# Patient Record
Sex: Male | Born: 1965 | Race: Asian | Hispanic: No | Marital: Married | Smoking: Never smoker
Health system: Southern US, Community
[De-identification: ages and names within clinical notes are randomized; demographics above are authoritative.]

---

## 2013-10-02 ENCOUNTER — Ambulatory Visit (INDEPENDENT_AMBULATORY_CARE_PROVIDER_SITE_OTHER): Payer: BC Managed Care – PPO | Admitting: Emergency Medicine

## 2013-10-02 ENCOUNTER — Ambulatory Visit (INDEPENDENT_AMBULATORY_CARE_PROVIDER_SITE_OTHER): Payer: BC Managed Care – PPO

## 2013-10-02 VITALS — BP 114/64 | HR 52 | Temp 97.6°F | Resp 20 | Ht 63.5 in | Wt 181.8 lb

## 2013-10-02 DIAGNOSIS — J209 Acute bronchitis, unspecified: Secondary | ICD-10-CM

## 2013-10-02 DIAGNOSIS — R05 Cough: Secondary | ICD-10-CM

## 2013-10-02 DIAGNOSIS — R059 Cough, unspecified: Secondary | ICD-10-CM

## 2013-10-02 LAB — CBC WITH DIFFERENTIAL/PLATELET
BASOS ABS: 0.1 10*3/uL (ref 0.0–0.1)
Basophils Relative: 1 % (ref 0–1)
Eosinophils Absolute: 0.2 10*3/uL (ref 0.0–0.7)
Eosinophils Relative: 4 % (ref 0–5)
HCT: 41.5 % (ref 39.0–52.0)
Hemoglobin: 14.2 g/dL (ref 13.0–17.0)
LYMPHS PCT: 49 % — AB (ref 12–46)
Lymphs Abs: 2.8 10*3/uL (ref 0.7–4.0)
MCH: 31.3 pg (ref 26.0–34.0)
MCHC: 34.2 g/dL (ref 30.0–36.0)
MCV: 91.4 fL (ref 78.0–100.0)
Monocytes Absolute: 0.6 10*3/uL (ref 0.1–1.0)
Monocytes Relative: 10 % (ref 3–12)
NEUTROS ABS: 2.1 10*3/uL (ref 1.7–7.7)
Neutrophils Relative %: 36 % — ABNORMAL LOW (ref 43–77)
Platelets: 144 10*3/uL — ABNORMAL LOW (ref 150–400)
RBC: 4.54 MIL/uL (ref 4.22–5.81)
RDW: 13.3 % (ref 11.5–15.5)
WBC: 5.8 10*3/uL (ref 4.0–10.5)

## 2013-10-02 LAB — COMPREHENSIVE METABOLIC PANEL
ALBUMIN: 3.9 g/dL (ref 3.5–5.2)
ALT: 49 U/L (ref 0–53)
AST: 63 U/L — AB (ref 0–37)
Alkaline Phosphatase: 48 U/L (ref 39–117)
BUN: 19 mg/dL (ref 6–23)
CHLORIDE: 107 meq/L (ref 96–112)
CO2: 26 mEq/L (ref 19–32)
CREATININE: 1.04 mg/dL (ref 0.50–1.35)
Calcium: 8.6 mg/dL (ref 8.4–10.5)
Glucose, Bld: 99 mg/dL (ref 70–99)
POTASSIUM: 3.9 meq/L (ref 3.5–5.3)
Sodium: 139 mEq/L (ref 135–145)
Total Bilirubin: 0.7 mg/dL (ref 0.2–1.2)
Total Protein: 7.1 g/dL (ref 6.0–8.3)

## 2013-10-02 MED ORDER — AZITHROMYCIN 250 MG PO TABS: ORAL_TABLET | ORAL | Status: DC

## 2013-10-02 NOTE — Progress Notes (Signed)
[facility] [address], [place] [id number] [phone]  Date:  10/02/2013   Name:  Sergio Smith   DOB:  03/08/1966   MRN:  [id number]  PCP:  No primary provider on file.    Chief Complaint: Sore Throat   History of Present Illness:  Sergio Smith is a 48 y.o. very pleasant male patient who presents with the following:  Traveled to [place] a month ago and stayed two weeks.  Since returning has a fever, cough and post nasal drainage and a sore throat.  Cough is productive of a bloody mucoid sputum.  Says he feels short of breath and is forced to sleep with a couple pillows propping him up.  Has no wheezing.  Has exertional shortness of breath and fatigue with stairs.  No nausea or vomiting.  Poor appetite.  No wheezing.  No peripheral edema.  No improvement with over the counter medications or other home remedies. Denies other complaint or health concern today.   There are no active problems to display for this patient.   No past medical history on file.  No past surgical history on file.  History  Substance Use Topics  . Smoking status: Never Smoker   . Smokeless tobacco: Never Used  . Alcohol Use: No    Family History  Problem Relation Age of Onset  . Diabetes Father   . Heart failure Father   . Hypertension Father   . Stroke Father     No Known Allergies  Medication list has been reviewed and updated.  No current outpatient prescriptions on file prior to visit.   No current facility-administered medications on file prior to visit.    Review of Systems:  As per HPI, otherwise negative.    Physical Examination: Filed Vitals:   10/02/13 1501  BP: 114/64  Pulse: 52  Temp: 97.6 F (36.4 C)  Resp: 20   Filed Vitals:   10/02/13 1501  Height: 5' 3.5" (1.613 m)  Weight: 181 lb 12.8 oz (82.464 kg)   Body mass index is 31.7 kg/(m^2). Ideal Body Weight: Weight in (lb) to have BMI = 25: 143.1  GEN: WDWN, NAD, Non-toxic, A & O x  3 HEENT: Atraumatic, Normocephalic. Neck supple. No masses, No LAD. Ears and Nose: No external deformity. CV: RRR, No M/G/R. No JVD. No thrill. No extra heart sounds. PULM: CTA B, no wheezes, crackles, rhonchi. No retractions. No resp. distress. No accessory muscle use. ABD: S, NT, ND, +BS. No rebound. No HSM. EXTR: No c/c/e NEURO Normal gait.  PSYCH: Normally interactive. Conversant. Not depressed or anxious appearing.  Calm demeanor.    Assessment and Plan: Bronchitis Anterior nose bleed right. zpak  Signed,  Phillips OdorJeffery Anderson, MD   [facility] reading (PRIMARY) by  Dr. Dareen PianoAnderson. Negative .

## 2013-10-02 NOTE — Patient Instructions (Signed)
Bronchitis  Bronchitis is inflammation of the airways that extend from the windpipe into the lungs (bronchi). The inflammation often causes mucus to develop, which leads to a cough. If the inflammation becomes severe, it may cause shortness of breath.  CAUSES   Bronchitis may be caused by:    Viral infections.    Bacteria.    Cigarette smoke.    Allergens, pollutants, and other irritants.   SIGNS AND SYMPTOMS   The most common symptom of bronchitis is a frequent cough that produces mucus. Other symptoms include:   Fever.    Body aches.    Chest congestion.    Chills.    Shortness of breath.    Sore throat.   DIAGNOSIS   Bronchitis is usually diagnosed through a medical history and physical exam. Tests, such as chest X-rays, are sometimes done to rule out other conditions.   TREATMENT   You may need to avoid contact with whatever caused the problem (smoking, for example). Medicines are sometimes needed. These may include:   Antibiotics. These may be prescribed if the condition is caused by bacteria.   Cough suppressants. These may be prescribed for relief of cough symptoms.    Inhaled medicines. These may be prescribed to help open your airways and make it easier for you to breathe.    Steroid medicines. These may be prescribed for those with recurrent (chronic) bronchitis.  HOME CARE INSTRUCTIONS   Get plenty of rest.    Drink enough fluids to keep your urine clear or pale yellow (unless you have a medical condition that requires fluid restriction). Increasing fluids may help thin your secretions and will prevent dehydration.    Only take over-the-counter or prescription medicines as directed by your health care provider.   Only take antibiotics as directed. Make sure you finish them even if you start to feel better.   Avoid secondhand smoke, irritating chemicals, and strong fumes. These will make bronchitis worse. If you are a smoker, quit smoking. Consider using nicotine gum or  skin patches to help control withdrawal symptoms. Quitting smoking will help your lungs heal faster.    Put a cool-mist humidifier in your bedroom at night to moisten the air. This may help loosen mucus. Change the water in the humidifier daily. You can also run the hot water in your shower and sit in the bathroom with the door closed for 5-10 minutes.    Follow up with your health care provider as directed.    Wash your hands frequently to avoid catching bronchitis again or spreading an infection to others.   SEEK MEDICAL CARE IF:  Your symptoms do not improve after 1 week of treatment.   SEEK IMMEDIATE MEDICAL CARE IF:   Your fever increases.   You have chills.    You have chest pain.    You have worsening shortness of breath.    You have bloody sputum.   You faint.   You have lightheadedness.   You have a severe headache.    You vomit repeatedly.  MAKE SURE YOU:    Understand these instructions.   Will watch your condition.   Will get help right away if you are not doing well or get worse.  Document Released: 03/10/2005 Document Revised: 12/29/2012 Document Reviewed: 11/02/2012  ExitCare Patient Information 2015 [employer]. This information is not intended to replace advice given to you by your health care provider. Make sure you discuss any questions you have with your health care   provider.

## 2013-10-03 ENCOUNTER — Encounter: Payer: Self-pay | Admitting: *Deleted

## 2014-09-01 ENCOUNTER — Ambulatory Visit (INDEPENDENT_AMBULATORY_CARE_PROVIDER_SITE_OTHER): Payer: Self-pay | Admitting: Emergency Medicine

## 2014-09-01 VITALS — BP 108/70 | HR 64 | Temp 98.1°F | Resp 16 | Ht 64.0 in | Wt 191.4 lb

## 2014-09-01 DIAGNOSIS — R319 Hematuria, unspecified: Secondary | ICD-10-CM

## 2014-09-01 DIAGNOSIS — Z021 Encounter for pre-employment examination: Secondary | ICD-10-CM

## 2014-09-01 LAB — POCT UA - MICROSCOPIC ONLY
BACTERIA, U MICROSCOPIC: NEGATIVE
Casts, Ur, LPF, POC: NEGATIVE
Crystals, Ur, HPF, POC: NEGATIVE
Mucus, UA: NEGATIVE
WBC, Ur, HPF, POC: 0
Yeast, UA: NEGATIVE

## 2014-09-01 NOTE — Progress Notes (Signed)
   Subjective:    Patient ID: Sergio Smith, male    DOB: 24-Aug-1965, 49 y.o.   MRN: [id number] This chart was scribed for Lesle Chris, MD by Littie Deeds, Medical Scribe. This patient was seen in room 11 and the patient's care was started at 5:00 PM.   HPI HPI Comments: Sergio Smith is a 49 y.o. male who [facility] the [facility] for [facility] physical exam. He drives to [place] for work. He used to lift weights. Patient denies any medical problems and does not take any regular medications.    Review of Systems  Constitutional: Negative.   HENT: Negative.   Eyes: Negative.   Respiratory: Negative.   Cardiovascular: Negative.   Gastrointestinal: Negative.   Endocrine: Negative.   Genitourinary: Negative.   Neurological: Negative.   Hematological: Negative.   Psychiatric/Behavioral: Negative.        Objective:   Physical Exam CONSTITUTIONAL: Well developed/well nourished HEAD: Normocephalic/atraumatic EYES: EOM/PERRL ENMT: Mucous membranes moist. Cerumen impaction bilaterally, worse on the left. NECK: supple no meningeal signs SPINE: entire spine nontender CV: S1/S2 noted, no murmurs/rubs/gallops noted LUNGS: Lungs are clear to auscultation bilaterally, no apparent distress ABDOMEN: soft, nontender, no rebound or guarding GU: Mild weakness at the left external inguinal ring, no definite hernia. 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 He had a trace amount of blood in his urine but urinalysis had only 0-3 red cells. I personally performed the services described in this documentation, which was scribed in my presence. The recorded information has been reviewed and is accurate.  Earl Lites, MD

## 2019-03-27 ENCOUNTER — Encounter (HOSPITAL_BASED_OUTPATIENT_CLINIC_OR_DEPARTMENT_OTHER): Payer: Self-pay

## 2019-03-27 ENCOUNTER — Inpatient Hospital Stay (HOSPITAL_BASED_OUTPATIENT_CLINIC_OR_DEPARTMENT_OTHER)
Admission: EM | Admit: 2019-03-27 | Discharge: 2019-04-01 | DRG: 177 | Disposition: A | Discharge status: Home / Self Care | Payer: BC Managed Care – PPO | Attending: Internal Medicine | Admitting: Internal Medicine

## 2019-03-27 ENCOUNTER — Other Ambulatory Visit: Payer: Self-pay

## 2019-03-27 ENCOUNTER — Emergency Department (HOSPITAL_BASED_OUTPATIENT_CLINIC_OR_DEPARTMENT_OTHER): Payer: BC Managed Care – PPO

## 2019-03-27 DIAGNOSIS — Z833 Family history of diabetes mellitus: Secondary | ICD-10-CM

## 2019-03-27 DIAGNOSIS — J9601 Acute respiratory failure with hypoxia: Secondary | ICD-10-CM

## 2019-03-27 DIAGNOSIS — U071 COVID-19: Secondary | ICD-10-CM | POA: Diagnosis present

## 2019-03-27 DIAGNOSIS — J9600 Acute respiratory failure, unspecified whether with hypoxia or hypercapnia: Secondary | ICD-10-CM | POA: Diagnosis not present

## 2019-03-27 DIAGNOSIS — Z8249 Family history of ischemic heart disease and other diseases of the circulatory system: Secondary | ICD-10-CM | POA: Diagnosis not present

## 2019-03-27 DIAGNOSIS — J1282 Pneumonia due to coronavirus disease 2019: Secondary | ICD-10-CM | POA: Diagnosis present

## 2019-03-27 LAB — COMPREHENSIVE METABOLIC PANEL
ALT: 85 U/L — ABNORMAL HIGH (ref 0–44)
AST: 85 U/L — ABNORMAL HIGH (ref 15–41)
Albumin: 3.1 g/dL — ABNORMAL LOW (ref 3.5–5.0)
Alkaline Phosphatase: 45 U/L (ref 38–126)
Anion gap: 10 (ref 5–15)
BUN: 22 mg/dL — ABNORMAL HIGH (ref 6–20)
CO2: 24 mmol/L (ref 22–32)
Calcium: 8.5 mg/dL — ABNORMAL LOW (ref 8.9–10.3)
Chloride: 106 mmol/L (ref 98–111)
Creatinine, Ser: 0.82 mg/dL (ref 0.61–1.24)
GFR calc Af Amer: >60 mL/min (ref >60)
GFR calc non Af Amer: >60 mL/min (ref >60)
Glucose, Bld: 136 mg/dL — ABNORMAL HIGH (ref 70–99)
Potassium: 3.4 mmol/L — ABNORMAL LOW (ref 3.5–5.1)
Sodium: 140 mmol/L (ref 135–145)
Total Bilirubin: 0.9 mg/dL (ref 0.3–1.2)
Total Protein: 6.4 g/dL — ABNORMAL LOW (ref 6.5–8.1)

## 2019-03-27 LAB — RESPIRATORY PANEL BY RT PCR (FLU A&B, COVID)
Influenza A by PCR: NEGATIVE
Influenza B by PCR: NEGATIVE
SARS Coronavirus 2 by RT PCR: POSITIVE — AB

## 2019-03-27 LAB — CBC WITH DIFFERENTIAL/PLATELET
Abs Immature Granulocytes: 0.04 10*3/uL (ref 0.00–0.07)
Basophils Absolute: 0 10*3/uL (ref 0.0–0.1)
Basophils Relative: 0 %
Eosinophils Absolute: 0 10*3/uL (ref 0.0–0.5)
Eosinophils Relative: 1 %
HCT: 39 % (ref 39.0–52.0)
Hemoglobin: 13.5 g/dL (ref 13.0–17.0)
Immature Granulocytes: 1 %
Lymphocytes Relative: 13 %
Lymphs Abs: 0.6 10*3/uL — ABNORMAL LOW (ref 0.7–4.0)
MCH: 33.3 pg (ref 26.0–34.0)
MCHC: 34.6 g/dL (ref 30.0–36.0)
MCV: 96.3 fL (ref 80.0–100.0)
Monocytes Absolute: 0.2 10*3/uL (ref 0.1–1.0)
Monocytes Relative: 4 %
Neutro Abs: 3.5 10*3/uL (ref 1.7–7.7)
Neutrophils Relative %: 81 %
Platelets: 143 10*3/uL — ABNORMAL LOW (ref 150–400)
RBC: 4.05 MIL/uL — ABNORMAL LOW (ref 4.22–5.81)
RDW: 11.9 % (ref 11.5–15.5)
Smear Review: NORMAL
WBC: 4.3 10*3/uL (ref 4.0–10.5)
nRBC: 0 % (ref 0.0–0.2)

## 2019-03-27 LAB — TROPONIN I (HIGH SENSITIVITY): Troponin I (High Sensitivity): 5 ng/L (ref <18)

## 2019-03-27 LAB — LACTIC ACID, PLASMA
Lactic Acid, Venous: 2 mmol/L (ref 0.5–1.9)
Lactic Acid, Venous: 1.8 mmol/L (ref 0.5–1.9)

## 2019-03-27 LAB — FIBRINOGEN: Fibrinogen: 655 mg/dL — ABNORMAL HIGH (ref 210–475)

## 2019-03-27 LAB — D-DIMER, QUANTITATIVE: D-Dimer, Quant: >20 ug/mL-FEU — ABNORMAL HIGH (ref 0.00–0.50)

## 2019-03-27 LAB — SARS CORONAVIRUS 2 AG (30 MIN TAT): SARS Coronavirus 2 Ag: NEGATIVE

## 2019-03-27 LAB — TRIGLYCERIDES: Triglycerides: 114 mg/dL (ref <150)

## 2019-03-27 LAB — C-REACTIVE PROTEIN: CRP: 14.8 mg/dL — ABNORMAL HIGH (ref <1.0)

## 2019-03-27 LAB — PROCALCITONIN: Procalcitonin: 2.01 ng/mL

## 2019-03-27 LAB — FERRITIN: Ferritin: 1710 ng/mL — ABNORMAL HIGH (ref 24–336)

## 2019-03-27 LAB — LACTATE DEHYDROGENASE: LDH: 465 U/L — ABNORMAL HIGH (ref 98–192)

## 2019-03-27 MED ORDER — DEXAMETHASONE SODIUM PHOSPHATE 10 MG/ML IJ SOLN
6.0000 mg | Freq: Once | INTRAMUSCULAR | Status: AC
  Administered 2019-03-27: 6 mg via INTRAVENOUS
  Filled 2019-03-27: qty 1

## 2019-03-27 NOTE — ED Provider Notes (Addendum)
[facility] EMERGENCY DEPARTMENT Provider Note   CSN: [id number] Arrival date & time: 03/27/19  1315     History Chief Complaint  Patient presents with  . COVID Symptoms    Sergio Smith is a 54 y.o. male.  HPI Patient presents with shortness of breath.  Has had for around the last week.  Had chills and a cough with some sputum production.  Has had no fevers.  Has had some vomiting.  States he has decreased sense of taste but still has not lost completely.  Denies any contact with anyone known to have Covid.  States he feels more short of breath and more fatigued.  Upon arrival found to have a saturation of 87%.  Required 4 L of oxygen to get up to 95%.  No lung problems.  Does not smoke.    History reviewed. No pertinent past medical history.  Patient Active Problem List   Diagnosis Date Noted  . Pneumonia due to COVID-19 virus 03/27/2019    History reviewed. No pertinent surgical history.     Family History  Problem Relation Age of Onset  . Diabetes Father   . Heart failure Father   . Hypertension Father   . Stroke Father     Social History   Tobacco Use  . Smoking status: Never Smoker  . Smokeless tobacco: Never Used  Substance Use Topics  . Alcohol use: No  . Drug use: No    Home Medications Prior to Admission medications   Medication Sig Start Date End Date Taking? Authorizing Provider  azithromycin (ZITHROMAX) 250 MG tablet Take 2 tabs PO x 1 dose, then 1 tab PO QD x 4 days Patient not taking: Reported on 09/01/2014 10/02/13   Roselee Culver, MD  naproxen sodium (ANAPROX) 220 MG tablet Take 220 mg by mouth 2 (two) times daily with a meal.    [provider]    Allergies    Patient has no known allergies.  Review of Systems   Review of Systems  Constitutional: Positive for appetite change, fatigue and fever.  HENT: Positive for congestion.   Respiratory: Positive for cough and shortness of breath.   Gastrointestinal: Positive  for nausea. Negative for abdominal pain.  Genitourinary: Negative for flank pain.  Musculoskeletal: Negative for back pain.  Skin: Negative for rash.  Neurological: Negative for weakness.    Physical Exam Updated Vital Signs BP 106/70   Pulse 72   Temp 98.5 F (36.9 C) (Oral)   Resp (!) 28   Ht 5\' 6"  (1.676 m)   Wt 59.9 kg   SpO2 95%   BMI 21.31 kg/m   Physical Exam Vitals and nursing note reviewed.  HENT:     Head: Normocephalic.  Eyes:     Extraocular Movements: Extraocular movements intact.     Pupils: Pupils are equal, round, and reactive to light.  Cardiovascular:     Rate and Rhythm: Regular rhythm.  Pulmonary:     Breath sounds: No wheezing, rhonchi or rales.     Comments: Harsh breath sounds without focal rales or rhonchi. Abdominal:     Tenderness: There is no abdominal tenderness.  Musculoskeletal:     Right lower leg: No edema.     Left lower leg: No edema.  Skin:    General: Skin is warm.     Capillary Refill: Capillary refill takes less than 2 seconds.  Neurological:     Mental Status: He is alert and oriented to person, place,  and time.     ED Results / Procedures / Treatments   Labs (all labs ordered are listed, but only abnormal results are displayed) Labs Reviewed  RESPIRATORY PANEL BY RT PCR (FLU A&B, COVID) - Abnormal; Notable for the following components:      Result Value   SARS Coronavirus 2 by RT PCR POSITIVE (*)    All other components within normal limits  LACTIC ACID, PLASMA - Abnormal; Notable for the following components:   Lactic Acid, Venous 2.0 (*)    All other components within normal limits  CBC WITH DIFFERENTIAL/PLATELET - Abnormal; Notable for the following components:   RBC 4.05 (*)    Platelets 143 (*)    Lymphs Abs 0.6 (*)    All other components within normal limits  COMPREHENSIVE METABOLIC PANEL - Abnormal; Notable for the following components:   Potassium 3.4 (*)    Glucose, Bld 136 (*)    BUN 22 (*)    Calcium  8.5 (*)    Total Protein 6.4 (*)    Albumin 3.1 (*)    AST 85 (*)    ALT 85 (*)    All other components within normal limits  D-DIMER, QUANTITATIVE (NOT AT [facility]) - Abnormal; Notable for the following components:   D-Dimer, Quant >20.00 (*)    All other components within normal limits  LACTATE DEHYDROGENASE - Abnormal; Notable for the following components:   LDH 465 (*)    All other components within normal limits  FERRITIN - Abnormal; Notable for the following components:   Ferritin 1,710 (*)    All other components within normal limits  FIBRINOGEN - Abnormal; Notable for the following components:   Fibrinogen 655 (*)    All other components within normal limits  C-REACTIVE PROTEIN - Abnormal; Notable for the following components:   CRP 14.8 (*)    All other components within normal limits  SARS CORONAVIRUS 2 AG (30 MIN TAT)  CULTURE, BLOOD (ROUTINE X 2)  CULTURE, BLOOD (ROUTINE X 2)  LACTIC ACID, PLASMA  PROCALCITONIN  TRIGLYCERIDES  TROPONIN I (HIGH SENSITIVITY)    EKG EKG Interpretation  Date/Time:  Sunday March 27 2019 15:00:09 EST Ventricular Rate:  74 PR Interval:    QRS Duration: 136 QT Interval:  429 QTC Calculation: 476 R Axis:   -96 Text Interpretation: Sinus rhythm RBBB and LAFB ST elev, probable normal early repol pattern When compared with ECG of EARLIER SAME DATE No significant change was found Confirmed by Dione Booze ([id number]) on 03/28/2019 6:03:33 AM   Radiology DG Chest Portable 1 View  Result Date: 03/27/2019 CLINICAL DATA:  Shortness of breath EXAM: PORTABLE CHEST 1 VIEW COMPARISON:  10/02/2013 FINDINGS: The heart size and mediastinal contours are within normal limits. Extensive bilateral, predominantly bibasilar heterogeneous airspace opacity. The visualized skeletal structures are unremarkable. IMPRESSION: Extensive bilateral, predominantly bibasilar heterogeneous airspace opacity, consistent with multifocal infection and COVID-19 if clinically  suspected. Electronically Signed   By: Lauralyn Primes M.D.   On: 03/27/2019 14:35    Procedures Procedures (including critical care time)  Medications Ordered in ED Medications  remdesivir 200 mg in sodium chloride 0.9% 250 mL IVPB (0 mg Intravenous Stopped 03/28/19 0245)    Followed by  remdesivir 100 mg in sodium chloride 0.9 % 100 mL IVPB (has no administration in time range)  dexamethasone (DECADRON) injection 6 mg (6 mg Intravenous Given 03/27/19 1703)    ED Course  I have reviewed the triage vital signs and the nursing  notes.  Pertinent labs & imaging results that were available during my care of the patient were reviewed by me and considered in my medical decision making (see chart for details).  Clinical Course as of Mar 28 1503  Wynelle Link Mar 27, 2019  1521 Pt signed out to me by Dr. Rubin Payor.  Briefly 54 yo male presenting to ED with hypoxia and suspected covid infection.  Antigen testing negative, but PCR pending, still suspicious of COVID with xray pattern.  On 4L [place].  No other medical issues.  Will need admission after results of PCR   [MT]    Clinical Course User Index [MT] Trifan, Kermit Balo, MD   MDM Rules/Calculators/A&P                     Patient with shortness of breath.  I think likely Covid although antigen test was negative.  X-ray shows multifocal pneumonia.  Requires 4 L of oxygen.  Confirmatory Covid test sent and I think will require admission either way  Care turned over to Dr. Gwenevere Ghazi.  CRITICAL CARE Performed by: Benjiman Core Total critical care time: 30 minutes Critical care time was exclusive of separately billable procedures and treating other patients. Critical care was necessary to treat or prevent imminent or life-threatening deterioration. Critical care was time spent personally by me on the following activities: development of treatment plan with patient and/or surrogate as well as nursing, discussions with consultants, evaluation of patient's response  to treatment, examination of patient, obtaining history from patient or surrogate, ordering and performing treatments and interventions, ordering and review of laboratory studies, ordering and review of radiographic studies, pulse oximetry and re-evaluation of patient's condition.   Final Clinical Impression(s) / ED Diagnoses Final diagnoses:  Acute hypoxemic respiratory failure [facility])    Rx / DC Orders ED Discharge Orders    None       Benjiman Core, MD 03/27/19 1452    Benjiman Core, MD 03/28/19 1506

## 2019-03-27 NOTE — ED Notes (Signed)
COVID + test, results given to ED MD

## 2019-03-27 NOTE — ED Notes (Signed)
Date and time results received: 03/27/19 1449   Test:lactic acid Critical Value:2.0 Name of Provider Notified: Rubin Payor Orders Received? Or Actions Taken?: no orders given

## 2019-03-27 NOTE — ED Notes (Signed)
Took 2 ES Tylenol x 2 hrs ago

## 2019-03-27 NOTE — ED Triage Notes (Signed)
Pt c/o chills, sweating, abd pain, vomiting, cough, ShOB x 1 week. Denies COVID contact.

## 2019-03-28 ENCOUNTER — Other Ambulatory Visit: Payer: Self-pay

## 2019-03-28 ENCOUNTER — Encounter (HOSPITAL_COMMUNITY): Payer: Self-pay | Admitting: Family Medicine

## 2019-03-28 DIAGNOSIS — J9601 Acute respiratory failure with hypoxia: Secondary | ICD-10-CM

## 2019-03-28 DIAGNOSIS — U071 COVID-19: Principal | ICD-10-CM

## 2019-03-28 DIAGNOSIS — J9600 Acute respiratory failure, unspecified whether with hypoxia or hypercapnia: Secondary | ICD-10-CM | POA: Diagnosis present

## 2019-03-28 MED ORDER — ONDANSETRON HCL 4 MG PO TABS
4.0000 mg | ORAL_TABLET | Freq: Four times a day (QID) | ORAL | Status: DC | PRN
  Administered 2019-03-31: 4 mg via ORAL
  Filled 2019-03-28: qty 1

## 2019-03-28 MED ORDER — ACETAMINOPHEN 650 MG RE SUPP: 650.0000 mg | Freq: Four times a day (QID) | RECTAL | Status: DC | PRN

## 2019-03-28 MED ORDER — SODIUM CHLORIDE 0.9 % IV SOLN
200.0000 mg | Freq: Once | INTRAVENOUS | Status: AC
  Administered 2019-03-28: 200 mg via INTRAVENOUS
  Filled 2019-03-28: qty 200
  Filled 2019-03-28: qty 40

## 2019-03-28 MED ORDER — ACETAMINOPHEN 325 MG PO TABS: 650.0000 mg | ORAL_TABLET | Freq: Four times a day (QID) | ORAL | Status: DC | PRN

## 2019-03-28 MED ORDER — ONDANSETRON HCL 4 MG/2ML IJ SOLN: 4.0000 mg | Freq: Four times a day (QID) | INTRAMUSCULAR | Status: DC | PRN

## 2019-03-28 MED ORDER — DEXAMETHASONE SODIUM PHOSPHATE 10 MG/ML IJ SOLN
6.0000 mg | INTRAMUSCULAR | Status: DC
  Administered 2019-03-28 – 2019-03-31 (×4): 6 mg via INTRAVENOUS
  Filled 2019-03-28 (×4): qty 1

## 2019-03-28 MED ORDER — SODIUM CHLORIDE 0.9 % IV SOLN
100.0000 mg | Freq: Every day | INTRAVENOUS | Status: AC
  Administered 2019-03-29 – 2019-04-01 (×4): 100 mg via INTRAVENOUS
  Filled 2019-03-28: qty 20
  Filled 2019-03-28 (×2): qty 100
  Filled 2019-03-28: qty 20
  Filled 2019-03-28: qty 100

## 2019-03-28 MED ORDER — ENOXAPARIN SODIUM 40 MG/0.4ML ~~LOC~~ SOLN
40.0000 mg | SUBCUTANEOUS | Status: DC
  Administered 2019-03-28: 40 mg via SUBCUTANEOUS
  Filled 2019-03-28: qty 0.4

## 2019-03-28 NOTE — H&P (Signed)
History and Physical    Sergio Smith OEH:[id number] DOB: July 08, 1965 DOA: 03/27/2019  PCP: Patient, No Pcp Per  Patient coming from: Home.  Chief Complaint: Shortness of breath.  HPI: Sergio Smith is a 54 y.o. male with no significant past medical history presents to the ER at [facility] with complaints of having increasing shortness of breath running nose and subjective feeling of fever chills and flulike symptoms ongoing for last 2 to 3 days.  Denies any recent travel or sick contacts.  Given the symptoms patient present to the ER.  ED Course: In the ER patient was hypoxic requiring almost 5 L oxygen chest x-ray showing bilateral infiltrates.  Temperature was 99 F.  EKG showed normal sinus rhythm with RBBB and abnormal ST-T changes.  Patient denies any chest pain and troponin was negative.  Covid test was positive.  Labs show ferritin 1700 CRP 14.5 procalcitonin 2 lactic acid 2.  Lactic acid improved to 1.8.  Potassium 3.4, CBC largely unremarkable.  D-dimer is more than 20.  Review of Systems: As per HPI, rest all negative.   History reviewed. No pertinent past medical history.  History reviewed. No pertinent surgical history.   reports that he has never smoked. He has never used smokeless tobacco. He reports that he does not drink alcohol or use drugs.  No Known Allergies  Family History  Problem Relation Age of Onset  . Diabetes Father   . Heart failure Father   . Hypertension Father   . Stroke Father     Prior to Admission medications   Medication Sig Start Date End Date Taking? Authorizing Provider  acetaminophen (TYLENOL) 500 MG tablet Take 500 mg by mouth every 6 (six) hours as needed for mild pain.   Yes [provider]  naproxen sodium (ANAPROX) 220 MG tablet Take 220 mg by mouth daily as needed (pain).    Yes [provider]  azithromycin (ZITHROMAX) 250 MG tablet Take 2 tabs PO x 1 dose, then 1 tab PO QD x 4 days Patient not taking:  Reported on 09/01/2014 10/02/13   Roselee Culver, MD    Physical Exam: Constitutional: Moderately built and nourished. Vitals:   03/28/19 1630 03/28/19 1656 03/28/19 1815 03/28/19 2031  BP: 117/77 117/72 111/77 109/72  Pulse: 87 91 86 78  Resp: (!) 26 (!) 34 18 (!) 22  Temp:  98.3 F (36.8 C) 99.2 F (37.3 C) 97.9 F (36.6 C)  TempSrc:  Oral Oral Oral  SpO2: 96% 95% 96% 97%  Weight:   61.5 kg   Height:   '5\' 6"'  (1.676 m)    Eyes: Anicteric no pallor. ENMT: No discharge from the ears eyes nose or mouth. Neck: No mass felt.  No neck rigidity. Respiratory: No rhonchi or crepitations. Cardiovascular: S1-S2 heard. Abdomen: Soft nontender bowel sounds present. Musculoskeletal: No edema.  No joint effusion. Skin: No rash. Neurologic: Alert awake oriented to time place and person.  Moves all extremities. Psychiatric: Appears normal per normal affect.   Labs on Admission: I have personally reviewed following labs and imaging studies  CBC: Recent Labs  Lab 03/27/19 1350  WBC 4.3  NEUTROABS 3.5  HGB 13.5  HCT 39.0  MCV 96.3  PLT 037*   Basic Metabolic Panel: Recent Labs  Lab 03/27/19 1350  NA 140  K 3.4*  CL 106  CO2 24  GLUCOSE 136*  BUN 22*  CREATININE 0.82  CALCIUM 8.5*   GFR: Estimated Creatinine Clearance: 92.7  mL/min (by C-G formula based on SCr of 0.82 mg/dL). Liver Function Tests: Recent Labs  Lab 03/27/19 1350  AST 85*  ALT 85*  ALKPHOS 45  BILITOT 0.9  PROT 6.4*  ALBUMIN 3.1*   No results for input(s): LIPASE, AMYLASE in the last 168 hours. No results for input(s): AMMONIA in the last 168 hours. Coagulation Profile: No results for input(s): INR, PROTIME in the last 168 hours. Cardiac Enzymes: No results for input(s): CKTOTAL, CKMB, CKMBINDEX, TROPONINI in the last 168 hours. BNP (last 3 results) No results for input(s): PROBNP in the last 8760 hours. HbA1C: No results for input(s): HGBA1C in the last 72 hours. CBG: No results for  input(s): GLUCAP in the last 168 hours. Lipid Profile: Recent Labs    03/27/19 1350  TRIG 114   Thyroid Function Tests: No results for input(s): TSH, T4TOTAL, FREET4, T3FREE, THYROIDAB in the last 72 hours. Anemia Panel: Recent Labs    03/27/19 1350  FERRITIN 1,710*   Urine analysis: No results found for: COLORURINE, APPEARANCEUR, LABSPEC, PHURINE, GLUCOSEU, HGBUR, BILIRUBINUR, KETONESUR, PROTEINUR, UROBILINOGEN, NITRITE, LEUKOCYTESUR Sepsis Labs: '@[email or web address]' (procalcitonin:4,lacticidven:4) ) Recent Results (from the past 240 hour(s))  Blood Culture (routine x 2)     Status: None (Preliminary result)   Collection Time: 03/27/19  1:50 PM   Specimen: BLOOD LEFT ARM  Result Value Ref Range Status   Specimen Description   Final    BLOOD LEFT ARM Performed at [facility], [address] [place],  [address]    Special Requests   Final    BOTTLES DRAWN AEROBIC AND ANAEROBIC Blood Culture adequate volume Performed at [facility], [address] [place], [place] [address]    Culture   Final    NO GROWTH < 24 HOURS Performed at [facility], [place] [address], [place], [place] [address]    Report Status PENDING  Incomplete  SARS Coronavirus 2 Ag (30 min TAT) - Nasal Swab (BD Veritor Kit)     Status: None   Collection Time: 03/27/19  2:00 PM   Specimen: Nasal Swab (BD Veritor Kit)  Result Value Ref Range Status   SARS Coronavirus 2 Ag NEGATIVE NEGATIVE Final    Comment: (NOTE) SARS-CoV-2 antigen NOT DETECTED.  Negative results are presumptive.  Negative results do not preclude SARS-CoV-2 infection and should not be used as the sole basis for treatment or other patient management decisions, including infection  control decisions, particularly in the presence of clinical signs and  symptoms consistent with COVID-19, or in those who have been in contact with the virus.  Negative results must be combined with clinical observations, patient  history, and epidemiological information. The expected result is Negative. Fact Sheet for Patients: PodPark.tn Fact Sheet for Healthcare Providers: GiftContent.is This test is not yet approved or cleared by the [place] FDA and  has been authorized for detection and/or diagnosis of SARS-CoV-2 by FDA under an Emergency Use Authorization (EUA).  This EUA will remain in effect (meaning this test can be used) for the duration of  the COVID-19 de claration under Section 564(b)(1) of the Act, 21 U.S.C. section 360bbb-3(b)(1), unless the authorization is terminated or revoked sooner. Performed at [facility], [address] [place], [place] [address]   Blood Culture (routine x 2)     Status: None (Preliminary result)   Collection Time: 03/27/19  2:20 PM   Specimen: BLOOD LEFT HAND  Result Value Ref Range Status  Specimen Description   Final    BLOOD LEFT HAND Performed at [facility], [address] [place], [place] [address]    Special Requests   Final    BOTTLES DRAWN AEROBIC AND ANAEROBIC Blood Culture adequate volume Performed at [facility], [address] [place], [place] [address]    Culture   Final    NO GROWTH < 24 HOURS Performed at [facility], [place] [address], [place], [place] [address]    Report Status PENDING  Incomplete  Respiratory Panel by RT PCR (Flu A&B, Covid) - Nasopharyngeal Swab     Status: Abnormal   Collection Time: 03/27/19  2:45 PM   Specimen: Nasopharyngeal Swab  Result Value Ref Range Status   SARS Coronavirus 2 by RT PCR POSITIVE (A) NEGATIVE Final    Comment: CRITICAL RESULT CALLED TO, READ BACK BY AND VERIFIED WITH: S. GOUGE, RN ([facility]) AT 1900 ON 03/27/19 BY C. JESSUP, MT. (NOTE) SARS-CoV-2 target nucleic acids are DETECTED. SARS-CoV-2 RNA is generally detectable in upper respiratory specimens  during the acute phase of infection. Positive  results are indicative of the presence of the identified virus, but do not rule out bacterial infection or co-infection with other pathogens not detected by the test. Clinical correlation with patient history and other diagnostic information is necessary to determine patient infection status. The expected result is Negative. Fact Sheet for Patients:  PinkCheek.be Fact Sheet for Healthcare Providers: GravelBags.it This test is not yet approved or cleared by the [place] FDA and  has been authorized for detection and/or diagnosis of SARS-CoV-2 by FDA under an Emergency Use Authorization (EUA).  This EUA will remain in effect (mea ning this test can be used) for the duration of  the COVID-19 declaration under Section 564(b)(1) of the Act, 21 U.S.C. section 360bbb-3(b)(1), unless the authorization is terminated or revoked sooner.    Influenza A by PCR NEGATIVE NEGATIVE Final   Influenza B by PCR NEGATIVE NEGATIVE Final    Comment: (NOTE) The Xpert Xpress SARS-CoV-2/FLU/RSV assay is intended as an aid in  the diagnosis of influenza from Nasopharyngeal swab specimens and  should not be used as a sole basis for treatment. Nasal washings and  aspirates are unacceptable for Xpert Xpress SARS-CoV-2/FLU/RSV  testing. Fact Sheet for Patients: PinkCheek.be Fact Sheet for Healthcare Providers: GravelBags.it This test is not yet approved or cleared by the [place] FDA and  has been authorized for detection and/or diagnosis of SARS-CoV-2 by  FDA under an Emergency Use Authorization (EUA). This EUA will remain  in effect (meaning this test can be used) for the duration of the  Covid-19 declaration under Section 564(b)(1) of the Act, 21  U.S.C. section 360bbb-3(b)(1), unless the authorization is  terminated or revoked. Performed at [facility], [place] [address],  [place], [place] [address]      Radiological Exams on Admission: DG Chest Portable 1 View  Result Date: 03/27/2019 CLINICAL DATA:  Shortness of breath EXAM: PORTABLE CHEST 1 VIEW COMPARISON:  10/02/2013 FINDINGS: The heart size and mediastinal contours are within normal limits. Extensive bilateral, predominantly bibasilar heterogeneous airspace opacity. The visualized skeletal structures are unremarkable. IMPRESSION: Extensive bilateral, predominantly bibasilar heterogeneous airspace opacity, consistent with multifocal infection and COVID-19 if clinically suspected. Electronically Signed   By: Eddie Candle M.D.   On: 03/27/2019 14:35    EKG: Independently reviewed.  Normal sinus rhythm with RBBB and abnormal ST-T changes.  Assessment/Plan Principal  Problem:   Acute hypoxemic respiratory failure (HCC) Active Problems:   Pneumonia due to COVID-19 virus   Acute respiratory failure due to COVID-19 ([place])    1. Acute respiratory failure with hypoxia secondary to COVID-19 pneumonia for which I have started patient on IV remdesivir and Decadron.  After discussing with patient about the off label use of Actemra and also his contraindications patient agrees to get it.  Given the markedly elevated D-dimer will check CT angiogram to rule out pulmonary embolism. 2. Abnormal EKG with patient complaining of no chest pain troponins were negative.  We will cycle cardiac markers.  Given the acute hypoxic respiratory failure with COVID-19 infection will need inpatient status.   DVT prophylaxis: Lovenox. Code Status: Full code. Family Communication: Discussed with patient. Disposition Plan: Home. Consults called: None. Admission status: Inpatient.   Rise Patience MD [facility] Pager [phone].  If 7PM-7AM, please contact night-coverage [email or web address] Password [facility]  03/28/2019, 9:42 PM

## 2019-03-28 NOTE — Plan of Care (Signed)
  Problem: Education: Goal: Knowledge of risk factors and measures for prevention of condition will improve Outcome: Progressing   Problem: Coping: Goal: Psychosocial and spiritual needs will be supported Outcome: Progressing   Problem: Respiratory: Goal: Will maintain a patent airway Outcome: Progressing Goal: Complications related to the disease process, condition or treatment will be avoided or minimized Outcome: Progressing   Problem: Education: Goal: Knowledge of General Education information will improve Description: Including pain rating scale, medication(s)/side effects and non-pharmacologic comfort measures Outcome: Completed/Met

## 2019-03-28 NOTE — ED Notes (Signed)
Pt given food brought by family member. Pt resting

## 2019-03-28 NOTE — ED Notes (Signed)
  Went to round on patient and placed him on humidified O2 and increased his rate from 4 to 5 L.  Patient said he was trying to fall asleep but felt like his breathing was getting worse.  Patient was told sleeping on his stomach would help with the SOB but he wanted to sit up in the bed.  Patient given juice and graham crackers.

## 2019-03-28 NOTE — ED Notes (Signed)
[facility] arrived to transport pt to [facility] 

## 2019-03-29 ENCOUNTER — Inpatient Hospital Stay (HOSPITAL_COMMUNITY): Payer: BC Managed Care – PPO

## 2019-03-29 DIAGNOSIS — J9601 Acute respiratory failure with hypoxia: Secondary | ICD-10-CM

## 2019-03-29 LAB — CBC WITH DIFFERENTIAL/PLATELET
Abs Immature Granulocytes: 0.08 10*3/uL — ABNORMAL HIGH (ref 0.00–0.07)
Basophils Absolute: 0 10*3/uL (ref 0.0–0.1)
Basophils Relative: 0 %
Eosinophils Absolute: 0 10*3/uL (ref 0.0–0.5)
Eosinophils Relative: 0 %
HCT: 37.4 % — ABNORMAL LOW (ref 39.0–52.0)
Hemoglobin: 12.4 g/dL — ABNORMAL LOW (ref 13.0–17.0)
Immature Granulocytes: 1 %
Lymphocytes Relative: 8 %
Lymphs Abs: 0.5 10*3/uL — ABNORMAL LOW (ref 0.7–4.0)
MCH: 33.4 pg (ref 26.0–34.0)
MCHC: 33.2 g/dL (ref 30.0–36.0)
MCV: 100.8 fL — ABNORMAL HIGH (ref 80.0–100.0)
Monocytes Absolute: 0.2 10*3/uL (ref 0.1–1.0)
Monocytes Relative: 3 %
Neutro Abs: 5.4 10*3/uL (ref 1.7–7.7)
Neutrophils Relative %: 88 %
Platelets: 151 10*3/uL (ref 150–400)
RBC: 3.71 MIL/uL — ABNORMAL LOW (ref 4.22–5.81)
RDW: 12.3 % (ref 11.5–15.5)
WBC: 6.1 10*3/uL (ref 4.0–10.5)
nRBC: 0 % (ref 0.0–0.2)

## 2019-03-29 LAB — D-DIMER, QUANTITATIVE: D-Dimer, Quant: >20 ug/mL-FEU — ABNORMAL HIGH (ref 0.00–0.50)

## 2019-03-29 LAB — COMPREHENSIVE METABOLIC PANEL
ALT: 80 U/L — ABNORMAL HIGH (ref 0–44)
AST: 68 U/L — ABNORMAL HIGH (ref 15–41)
Albumin: 2.7 g/dL — ABNORMAL LOW (ref 3.5–5.0)
Alkaline Phosphatase: 46 U/L (ref 38–126)
Anion gap: 6 (ref 5–15)
BUN: 23 mg/dL — ABNORMAL HIGH (ref 6–20)
CO2: 23 mmol/L (ref 22–32)
Calcium: 8.2 mg/dL — ABNORMAL LOW (ref 8.9–10.3)
Chloride: 110 mmol/L (ref 98–111)
Creatinine, Ser: 0.7 mg/dL (ref 0.61–1.24)
GFR calc Af Amer: >60 mL/min (ref >60)
GFR calc non Af Amer: >60 mL/min (ref >60)
Glucose, Bld: 155 mg/dL — ABNORMAL HIGH (ref 70–99)
Potassium: 4.4 mmol/L (ref 3.5–5.1)
Sodium: 139 mmol/L (ref 135–145)
Total Bilirubin: 0.7 mg/dL (ref 0.3–1.2)
Total Protein: 6 g/dL — ABNORMAL LOW (ref 6.5–8.1)

## 2019-03-29 LAB — HIV ANTIBODY (ROUTINE TESTING W REFLEX): HIV Screen 4th Generation wRfx: NONREACTIVE

## 2019-03-29 LAB — TROPONIN I (HIGH SENSITIVITY): Troponin I (High Sensitivity): 3 ng/L

## 2019-03-29 LAB — C-REACTIVE PROTEIN: CRP: 7.5 mg/dL — ABNORMAL HIGH (ref <1.0)

## 2019-03-29 LAB — ABO/RH: ABO/RH(D): O POS

## 2019-03-29 LAB — FERRITIN: Ferritin: 1185 ng/mL — ABNORMAL HIGH (ref 24–336)

## 2019-03-29 MED ORDER — ENOXAPARIN SODIUM 40 MG/0.4ML ~~LOC~~ SOLN
40.0000 mg | Freq: Two times a day (BID) | SUBCUTANEOUS | Status: DC
  Administered 2019-03-29 – 2019-04-01 (×6): 40 mg via SUBCUTANEOUS
  Filled 2019-03-29 (×7): qty 0.4

## 2019-03-29 MED ORDER — IOHEXOL 350 MG/ML SOLN
80.0000 mL | Freq: Once | INTRAVENOUS | Status: AC | PRN
  Administered 2019-03-29: 80 mL via INTRAVENOUS

## 2019-03-29 MED ORDER — TOCILIZUMAB 400 MG/20ML IV SOLN
500.0000 mg | Freq: Once | INTRAVENOUS | Status: AC
  Administered 2019-03-29: 500 mg via INTRAVENOUS
  Filled 2019-03-29: qty 20

## 2019-03-29 MED ORDER — SODIUM CHLORIDE (PF) 0.9 % IJ SOLN
INTRAMUSCULAR | Status: AC
  Filled 2019-03-29: qty 50

## 2019-03-29 NOTE — Progress Notes (Signed)
PROGRESS NOTE  Sergio Smith  DOB: Nov 06, 1965  PCP: Patient, No Pcp Per SJG:[id number]  DOA: 03/27/2019 Admitted From: Home  LOS: 2 days   Chief Complaint  Patient presents with  . COVID Symptoms   Brief narrative: Sergio Smith is a 54 y.o. male with no significant past medical history who presented to the ED on 03/27/2019 with complaint of worsening shortness of breath, runny nose and subjective feeling of fever and chills for 2 to 3 days.   In the ER, patient was hypoxic requiring almost 5 L oxygen. chest x-ray showed bilateral infiltrates.   EKG showed normal sinus rhythm with RBBB and abnormal ST-T changes.   Covid antigen was positive WBC count normal at 6.1, procalcitonin level elevated to 2, lactic acid was elevated 2. Inflammatory markers including ferritin, CRP and D-dimer elevated as well.   Subjective: Patient was seen and examined this morning.  Pleasant middle-aged male of [place] origin.  Propped up in bed.  On 4 L oxygen by nasal cannula.  Does not use oxygen at home. Feels the same as presentation.  Assessment/Plan: COVID pneumonia Acute respiratory failure with hypoxia  -Presented with hypoxia, shortness of breath, chills, fever -Chest imaging -chest x-ray showed bilateral infiltrates -Treatment: Patient got 1 dose of Actemra in the ED.  Continue Decadron 6 mg daily for 10 days, IV Remdesivir for 5 days to complete on 1/8. -Supportive care: Vitamin C, Zinc, inhalers, Tylenol, Antitussives - benzonatate, Mucinex -Oxygen - SpO2: 97 % O2 Flow Rate (L/min): 5 L/min -Continue airborne/contact isolation precautions. -Labs and biomarker trend as below  Lab Results  Component Value Date   SARSCOV2NAA POSITIVE (A) 03/27/2019    Recent Labs  Lab 03/27/19 1350 03/29/19 0324  WBC 4.3 6.1   Recent Labs    03/27/19 1350 03/29/19 0324  DDIMER >20.00* >20.00*  FERRITIN 1,710* 1,185*  LDH 465*  --   CRP 14.8* 7.5*   Elevated D-dimer -Likely because of Covid  itself. -CT angio chest was obtained in the ED which did not show any pulmonary medicine.  Showed diffuse bilateral airspace disease, right greater than left compatible with multifocal pneumonia.  Mobility: Encourage ambulation Diet: Regular diet Fluid: No IV fluid DVT prophylaxis:  Lovenox subcu Code Status:  Full code Family Communication:  Patient stated that he will update the family by himself. Expected Discharge:  Continue inpatient management.  Consultants:  None  Procedures:  None  Antimicrobials: Anti-infectives (From admission, onward)   Start     Dose/Rate Route Frequency Ordered Stop   03/29/19 1000  remdesivir 100 mg in sodium chloride 0.9 % 100 mL IVPB     100 mg 200 mL/hr over 30 Minutes Intravenous Daily 03/28/19 0038 04/02/19 0959   03/28/19 0200  remdesivir 200 mg in sodium chloride 0.9% 250 mL IVPB     200 mg 580 mL/hr over 30 Minutes Intravenous Once 03/28/19 0038 03/28/19 0245        Code Status: Full Code   Diet Order            Diet regular Room service appropriate? Yes; Fluid consistency: Thin  Diet effective now              Infusions:  . remdesivir 100 mg in NS 100 mL 100 mg (03/29/19 1102)    Scheduled Meds: . dexamethasone (DECADRON) injection  6 mg Intravenous Q24H  . enoxaparin (LOVENOX) injection  40 mg Subcutaneous Q12H  . sodium chloride (PF)  PRN meds: acetaminophen **OR** acetaminophen, ondansetron **OR** ondansetron (ZOFRAN) IV   Objective: Vitals:   03/28/19 2031 03/29/19 0604  BP: 109/72 106/76  Pulse: 78 65  Resp: (!) 22 20  Temp: 97.9 F (36.6 C) 98 F (36.7 C)  SpO2: 97% 97%    Intake/Output Summary (Last 24 hours) at 03/29/2019 1235 Last data filed at 03/29/2019 0950 Gross per 24 hour  Intake 219.94 ml  Output 0 ml  Net 219.94 ml   Filed Weights   03/27/19 1334 03/28/19 1815  Weight: 59.9 kg 61.5 kg   Weight change: 1.625 kg Body mass index is 21.88 kg/m.   Physical Exam: General exam:  Appears calm and comfortable.  Skin: No rashes, lesions or ulcers. HEENT: Atraumatic, normocephalic, supple neck, no obvious bleeding Lungs: Clear to auscultate bilaterally CVS: Regular rate and rhythm, no murmur GI/Abd soft, nontender, nondistended, bowel sound present CNS: Alert, awake, oriented x3 Psychiatry: Mood appropriate Extremities: No pedal edema, no calf tenderness  Data Review: I have personally reviewed the laboratory data and studies available.  Recent Labs  Lab 03/27/19 1350 03/29/19 0324  WBC 4.3 6.1  NEUTROABS 3.5 5.4  HGB 13.5 12.4*  HCT 39.0 37.4*  MCV 96.3 100.8*  PLT 143* 151   Recent Labs  Lab 03/27/19 1350 03/29/19 0324  NA 140 139  K 3.4* 4.4  CL 106 110  CO2 24 23  GLUCOSE 136* 155*  BUN 22* 23*  CREATININE 0.82 0.70  CALCIUM 8.5* 8.2*    Lorin Glass, MD  [facility] 03/29/2019

## 2019-03-29 NOTE — Plan of Care (Signed)
  Problem: Education: Goal: Knowledge of risk factors and measures for prevention of condition will improve Outcome: Progressing   Problem: Coping: Goal: Psychosocial and spiritual needs will be supported Outcome: Progressing   Problem: Respiratory: Goal: Will maintain a patent airway Outcome: Progressing Goal: Complications related to the disease process, condition or treatment will be avoided or minimized Outcome: Progressing   Problem: Health Behavior/Discharge Planning: Goal: Ability to manage health-related needs will improve Outcome: Progressing   Problem: Clinical Measurements: Goal: Ability to maintain clinical measurements within normal limits will improve Outcome: Progressing Goal: Will remain free from infection Outcome: Progressing Goal: Diagnostic test results will improve Outcome: Progressing Goal: Respiratory complications will improve Outcome: Progressing Goal: Cardiovascular complication will be avoided Outcome: Progressing   Problem: Activity: Goal: Risk for activity intolerance will decrease Outcome: Progressing   Problem: Nutrition: Goal: Adequate nutrition will be maintained Outcome: Progressing   Problem: Coping: Goal: Level of anxiety will decrease Outcome: Progressing   Problem: Elimination: Goal: Will not experience complications related to urinary retention Outcome: Progressing   Problem: Pain Managment: Goal: General experience of comfort will improve Outcome: Progressing   Problem: Safety: Goal: Ability to remain free from injury will improve Outcome: Progressing   Problem: Skin Integrity: Goal: Risk for impaired skin integrity will decrease Outcome: Progressing

## 2019-03-30 LAB — CBC WITH DIFFERENTIAL/PLATELET
Abs Immature Granulocytes: 0.04 10*3/uL (ref 0.00–0.07)
Basophils Absolute: 0 10*3/uL (ref 0.0–0.1)
Basophils Relative: 0 %
Eosinophils Absolute: 0 10*3/uL (ref 0.0–0.5)
Eosinophils Relative: 0 %
HCT: 37.3 % — ABNORMAL LOW (ref 39.0–52.0)
Hemoglobin: 12.1 g/dL — ABNORMAL LOW (ref 13.0–17.0)
Immature Granulocytes: 1 %
Lymphocytes Relative: 13 %
Lymphs Abs: 0.6 10*3/uL — ABNORMAL LOW (ref 0.7–4.0)
MCH: 33.2 pg (ref 26.0–34.0)
MCHC: 32.4 g/dL (ref 30.0–36.0)
MCV: 102.5 fL — ABNORMAL HIGH (ref 80.0–100.0)
Monocytes Absolute: 0.2 10*3/uL (ref 0.1–1.0)
Monocytes Relative: 5 %
Neutro Abs: 3.9 10*3/uL (ref 1.7–7.7)
Neutrophils Relative %: 81 %
Platelets: 181 10*3/uL (ref 150–400)
RBC: 3.64 MIL/uL — ABNORMAL LOW (ref 4.22–5.81)
RDW: 12.3 % (ref 11.5–15.5)
WBC: 4.7 10*3/uL (ref 4.0–10.5)
nRBC: 0 % (ref 0.0–0.2)

## 2019-03-30 LAB — COMPREHENSIVE METABOLIC PANEL
ALT: 87 U/L — ABNORMAL HIGH (ref 0–44)
AST: 57 U/L — ABNORMAL HIGH (ref 15–41)
Albumin: 2.8 g/dL — ABNORMAL LOW (ref 3.5–5.0)
Alkaline Phosphatase: 51 U/L (ref 38–126)
Anion gap: 8 (ref 5–15)
BUN: 26 mg/dL — ABNORMAL HIGH (ref 6–20)
CO2: 23 mmol/L (ref 22–32)
Calcium: 8.6 mg/dL — ABNORMAL LOW (ref 8.9–10.3)
Chloride: 107 mmol/L (ref 98–111)
Creatinine, Ser: 0.71 mg/dL (ref 0.61–1.24)
GFR calc Af Amer: >60 mL/min (ref >60)
GFR calc non Af Amer: >60 mL/min (ref >60)
Glucose, Bld: 158 mg/dL — ABNORMAL HIGH (ref 70–99)
Potassium: 4.6 mmol/L (ref 3.5–5.1)
Sodium: 138 mmol/L (ref 135–145)
Total Bilirubin: 0.6 mg/dL (ref 0.3–1.2)
Total Protein: 6 g/dL — ABNORMAL LOW (ref 6.5–8.1)

## 2019-03-30 LAB — C-REACTIVE PROTEIN: CRP: 3.4 mg/dL — ABNORMAL HIGH (ref <1.0)

## 2019-03-30 LAB — FERRITIN: Ferritin: 977 ng/mL — ABNORMAL HIGH (ref 24–336)

## 2019-03-30 LAB — D-DIMER, QUANTITATIVE: D-Dimer, Quant: 11.64 ug/mL-FEU — ABNORMAL HIGH (ref 0.00–0.50)

## 2019-03-30 MED ORDER — ZINC SULFATE 220 (50 ZN) MG PO CAPS
220.0000 mg | ORAL_CAPSULE | Freq: Every day | ORAL | Status: DC
  Administered 2019-03-30 – 2019-04-01 (×3): 220 mg via ORAL
  Filled 2019-03-30 (×3): qty 1

## 2019-03-30 MED ORDER — THIAMINE HCL 100 MG PO TABS
100.0000 mg | ORAL_TABLET | Freq: Every day | ORAL | Status: DC
  Administered 2019-03-30 – 2019-04-01 (×3): 100 mg via ORAL
  Filled 2019-03-30 (×3): qty 1

## 2019-03-30 MED ORDER — ADULT MULTIVITAMIN W/MINERALS CH
1.0000 | ORAL_TABLET | Freq: Every day | ORAL | Status: DC
  Administered 2019-03-30 – 2019-04-01 (×3): 1 via ORAL
  Filled 2019-03-30 (×3): qty 1

## 2019-03-30 MED ORDER — ASCORBIC ACID 500 MG PO TABS
500.0000 mg | ORAL_TABLET | Freq: Every day | ORAL | Status: DC
  Administered 2019-03-30 – 2019-04-01 (×3): 500 mg via ORAL
  Filled 2019-03-30 (×3): qty 1

## 2019-03-30 MED ORDER — HYDROCOD POLST-CPM POLST ER 10-8 MG/5ML PO SUER
5.0000 mL | Freq: Two times a day (BID) | ORAL | Status: DC | PRN
  Administered 2019-03-30 – 2019-04-01 (×3): 5 mL via ORAL
  Filled 2019-03-30 (×3): qty 5

## 2019-03-30 MED ORDER — GUAIFENESIN-DM 100-10 MG/5ML PO SYRP: 10.0000 mL | ORAL_SOLUTION | ORAL | Status: DC | PRN

## 2019-03-30 NOTE — Progress Notes (Signed)
PROGRESS NOTE  Sergio Smith  DOB: 1965/09/06  PCP: Patient, No Pcp Per [id number]  DOA: 03/27/2019 Admitted From: Home  LOS: 3 days   Chief Complaint  Patient presents with  . COVID Symptoms   Brief narrative: Sergio Smith is a 54 y.o. male with no significant past medical history who presented to the ED on 03/27/2019 with complaint of worsening shortness of breath, runny nose and subjective feeling of fever and chills for 2 to 3 days.   In the ER, patient was hypoxic requiring almost 5 L oxygen. chest x-ray showed bilateral infiltrates.   EKG showed normal sinus rhythm with RBBB and abnormal ST-T changes.   Covid antigen was positive WBC count normal at 6.1, procalcitonin level elevated to 2, lactic acid was elevated 2. Inflammatory markers including ferritin, CRP and D-dimer elevated as well.   Subjective: Patient was seen and examined this morning.  Pleasant middle-aged male of [place] origin.   Sitting up in bed.  Still remains in supplemental oxygen but at lower level 1 to 2 L/min.  He continues to feel inability to bring up phlegm.  Assessment/Plan: COVID pneumonia Acute respiratory failure with hypoxia  -Presented with hypoxia, shortness of breath, chills, fever -Chest imaging -chest x-ray showed bilateral infiltrates -Treatment: Patient got 1 dose of Actemra in the ED.  Continue Decadron 6 mg daily for 10 days, IV Remdesivir for 5 days to complete on 1/8. -Supportive care: Vitamin C, Zinc, inhalers, Tylenol, Antitussives - benzonatate, Mucinex -Oxygen - SpO2: 94 % O2 Flow Rate (L/min): 1 L/min -Continue airborne/contact isolation precautions. -WBC and inflammatory markers trend as below showing improvement in D-dimer, ferritin as well as CRP.  Lab Results  Component Value Date   SARSCOV2NAA POSITIVE (A) 03/27/2019    Recent Labs  Lab 03/27/19 1350 03/29/19 0324 03/30/19 0320  WBC 4.3 6.1 4.7   Recent Labs    03/27/19 1350 03/29/19 0324 03/30/19 0320    DDIMER >20.00* >20.00* 11.64*  FERRITIN 1,710* 1,185* 977*  LDH 465*  --   --   CRP 14.8* 7.5* 3.4*   Elevated D-dimer -Likely because of Covid itself. -CT angio chest was obtained in the ED which did not show any pulmonary medicine.  Showed diffuse bilateral airspace disease, right greater than left compatible with multifocal pneumonia.  Mobility: Encourage ambulation Diet: Regular diet Fluid: No IV fluid DVT prophylaxis:  Lovenox subcu Code Status:  Full code Family Communication:  Patient stated that he will update the family by himself. Expected Discharge:  Continue inpatient management.  Consultants:  None  Procedures:  None  Antimicrobials: Anti-infectives (From admission, onward)   Start     Dose/Rate Route Frequency Ordered Stop   03/29/19 1000  remdesivir 100 mg in sodium chloride 0.9 % 100 mL IVPB     100 mg 200 mL/hr over 30 Minutes Intravenous Daily 03/28/19 0038 04/02/19 0959   03/28/19 0200  remdesivir 200 mg in sodium chloride 0.9% 250 mL IVPB     200 mg 580 mL/hr over 30 Minutes Intravenous Once 03/28/19 0038 03/28/19 0245        Code Status: Full Code   Diet Order            Diet regular Room service appropriate? Yes; Fluid consistency: Thin  Diet effective now              Infusions:  . remdesivir 100 mg in NS 100 mL 100 mg (03/30/19 1016)    Scheduled Meds: . vitamin C  500 mg Oral Daily  . dexamethasone (DECADRON) injection  6 mg Intravenous Q24H  . enoxaparin (LOVENOX) injection  40 mg Subcutaneous Q12H  . multivitamin with minerals  1 tablet Oral Daily  . thiamine  100 mg Oral Daily  . zinc sulfate  220 mg Oral Daily    PRN meds: acetaminophen **OR** acetaminophen, chlorpheniramine-HYDROcodone, guaiFENesin-dextromethorphan, ondansetron **OR** ondansetron (ZOFRAN) IV   Objective: Vitals:   03/30/19 1018 03/30/19 1226  BP:  97/69  Pulse:  79  Resp:  18  Temp:  98.9 F (37.2 C)  SpO2: 93% 94%    Intake/Output Summary  (Last 24 hours) at 03/30/2019 1343 Last data filed at 03/30/2019 1016 Gross per 24 hour  Intake 700 ml  Output 250 ml  Net 450 ml   Filed Weights   03/27/19 1334 03/28/19 1815  Weight: 59.9 kg 61.5 kg   Weight change:  Body mass index is 21.88 kg/m.   Physical Exam: General exam: Appears calm and comfortable.  Skin: No rashes, lesions or ulcers. HEENT: Atraumatic, normocephalic, supple neck, no obvious bleeding Lungs: Clear to auscultate bilaterally, no wheezing, no crackles. CVS: Regular rate and rhythm, no murmur GI/Abd soft, nontender, nondistended, bowel sound present CNS: Alert, awake, oriented x3 Psychiatry: Mood appropriate Extremities: No pedal edema, no calf tenderness  Data Review: I have personally reviewed the laboratory data and studies available.  Recent Labs  Lab 03/27/19 1350 03/29/19 0324 03/30/19 0320  WBC 4.3 6.1 4.7  NEUTROABS 3.5 5.4 3.9  HGB 13.5 12.4* 12.1*  HCT 39.0 37.4* 37.3*  MCV 96.3 100.8* 102.5*  PLT 143* 151 181   Recent Labs  Lab 03/27/19 1350 03/29/19 0324 03/30/19 0320  NA 140 139 138  K 3.4* 4.4 4.6  CL 106 110 107  CO2 24 23 23   GLUCOSE 136* 155* 158*  BUN 22* 23* 26*  CREATININE 0.82 0.70 0.71  CALCIUM 8.5* 8.2* 8.6*    Terrilee Croak, MD  [facility] 03/30/2019

## 2019-03-30 NOTE — Plan of Care (Signed)
  Problem: Clinical Measurements: Goal: Respiratory complications will improve Outcome: Progressing Goal: Cardiovascular complication will be avoided Outcome: Progressing   Problem: Education: Goal: Knowledge of risk factors and measures for prevention of condition will improve Outcome: Completed/Met   Problem: Coping: Goal: Psychosocial and spiritual needs will be supported Outcome: Completed/Met   Problem: Respiratory: Goal: Complications related to the disease process, condition or treatment will be avoided or minimized Outcome: Completed/Met

## 2019-03-30 NOTE — TOC Progression Note (Addendum)
Transition of Care [facility]) - Progression Note    Patient Details  Name: Sergio Smith MRN: [id number] Date of Birth: [birth year]  Transition [facility] CM/SW Contact  Geni Bers, RN Phone Number: 03/30/2019, 4:52 PM  Clinical Narrative:   TOC will continue to follow pt for discharge needs. Pt speaks English also.         Expected Discharge Plan and Services                                                 Social Determinants of Health (SDOH) Interventions    Readmission Risk Interventions No flowsheet data found.

## 2019-03-31 LAB — CBC WITH DIFFERENTIAL/PLATELET
Abs Immature Granulocytes: 0.07 10*3/uL (ref 0.00–0.07)
Basophils Absolute: 0 10*3/uL (ref 0.0–0.1)
Basophils Relative: 0 %
Eosinophils Absolute: 0 10*3/uL (ref 0.0–0.5)
Eosinophils Relative: 0 %
HCT: 39.9 % (ref 39.0–52.0)
Hemoglobin: 13.1 g/dL (ref 13.0–17.0)
Immature Granulocytes: 2 %
Lymphocytes Relative: 21 %
Lymphs Abs: 0.9 10*3/uL (ref 0.7–4.0)
MCH: 33.2 pg (ref 26.0–34.0)
MCHC: 32.8 g/dL (ref 30.0–36.0)
MCV: 101.3 fL — ABNORMAL HIGH (ref 80.0–100.0)
Monocytes Absolute: 0.4 10*3/uL (ref 0.1–1.0)
Monocytes Relative: 10 %
Neutro Abs: 2.8 10*3/uL (ref 1.7–7.7)
Neutrophils Relative %: 67 %
Platelets: 174 10*3/uL (ref 150–400)
RBC: 3.94 MIL/uL — ABNORMAL LOW (ref 4.22–5.81)
RDW: 12.1 % (ref 11.5–15.5)
WBC: 4.1 10*3/uL (ref 4.0–10.5)
nRBC: 0 % (ref 0.0–0.2)

## 2019-03-31 LAB — COMPREHENSIVE METABOLIC PANEL
ALT: 88 U/L — ABNORMAL HIGH (ref 0–44)
AST: 47 U/L — ABNORMAL HIGH (ref 15–41)
Albumin: 3 g/dL — ABNORMAL LOW (ref 3.5–5.0)
Alkaline Phosphatase: 51 U/L (ref 38–126)
Anion gap: 8 (ref 5–15)
BUN: 27 mg/dL — ABNORMAL HIGH (ref 6–20)
CO2: 26 mmol/L (ref 22–32)
Calcium: 8.7 mg/dL — ABNORMAL LOW (ref 8.9–10.3)
Chloride: 102 mmol/L (ref 98–111)
Creatinine, Ser: 0.96 mg/dL (ref 0.61–1.24)
GFR calc Af Amer: >60 mL/min (ref >60)
GFR calc non Af Amer: >60 mL/min (ref >60)
Glucose, Bld: 137 mg/dL — ABNORMAL HIGH (ref 70–99)
Potassium: 4.6 mmol/L (ref 3.5–5.1)
Sodium: 136 mmol/L (ref 135–145)
Total Bilirubin: 0.7 mg/dL (ref 0.3–1.2)
Total Protein: 6.3 g/dL — ABNORMAL LOW (ref 6.5–8.1)

## 2019-03-31 LAB — C-REACTIVE PROTEIN: CRP: 1.7 mg/dL — ABNORMAL HIGH (ref <1.0)

## 2019-03-31 LAB — FERRITIN: Ferritin: 941 ng/mL — ABNORMAL HIGH (ref 24–336)

## 2019-03-31 LAB — D-DIMER, QUANTITATIVE: D-Dimer, Quant: 6.06 ug/mL-FEU — ABNORMAL HIGH (ref 0.00–0.50)

## 2019-03-31 MED ORDER — LORATADINE 10 MG PO TABS
10.0000 mg | ORAL_TABLET | Freq: Every day | ORAL | Status: DC | PRN
  Administered 2019-03-31: 10 mg via ORAL
  Filled 2019-03-31: qty 1

## 2019-03-31 NOTE — Progress Notes (Signed)
PROGRESS NOTE  Sergio Smith  DOB: 1965-05-30  PCP: Patient, No Pcp Per JOA:[id number]  DOA: 03/27/2019 Admitted From: Home  LOS: 4 days   Chief Complaint  Patient presents with  . COVID Symptoms   Brief narrative: Sergio Smith is a 54 y.o. male with no significant past medical history who presented to the ED on 03/27/2019 with complaint of worsening shortness of breath, runny nose and subjective feeling of fever and chills for 2 to 3 days.   In the ER, patient was hypoxic requiring almost 5 L oxygen. chest x-ray showed bilateral infiltrates.   EKG showed normal sinus rhythm with RBBB and abnormal ST-T changes.   Covid antigen was positive WBC count normal at 6.1, procalcitonin level elevated to 2, lactic acid was elevated 2. Inflammatory markers including ferritin, CRP and D-dimer elevated as well.   Subjective: Patient was seen and examined this morning. Pleasant middle-aged male of [place] origin.   Sitting up in bed.  Not in distress.  Not on oxygen supplementation at rest today.  However, he continues to report inability to bring up phlegm.  When he walked to the bathroom in the morning, he felt really winded.  Assessment/Plan: COVID pneumonia Acute respiratory failure with hypoxia  -Presented with hypoxia, shortness of breath, chills, fever -Chest imaging -chest x-ray showed bilateral infiltrates -Treatment: Patient got 1 dose of Actemra in the ED.  Continue Decadron 6 mg daily for 10 days, IV Remdesivir for 5 days to complete on 1/8. -Supportive care: Vitamin C, Zinc, inhalers, Tylenol, Antitussives - benzonatate, Mucinex -Oxygen -patient required oxygen supplementation at first.  Off oxygen today.-Continue airborne/contact isolation precautions. -WBC and inflammatory markers trend as below showing improvement in D-dimer, ferritin as well as CRP.  -Clinically patient still has not improved significantly.  Patient did not feel comfortable to go home today.  Lab Results    Component Value Date   SARSCOV2NAA POSITIVE (A) 03/27/2019    Recent Labs  Lab 03/27/19 1350 03/29/19 0324 03/30/19 0320 03/31/19 0347  WBC 4.3 6.1 4.7 4.1   Recent Labs    03/29/19 0324 03/30/19 0320 03/31/19 0347  DDIMER >20.00* 11.64* 6.06*  FERRITIN 1,185* 977* 941*  CRP 7.5* 3.4* 1.7*   Elevated D-dimer -Likely because of Covid itself. -CT angio chest was obtained in the ED which did not show any pulmonary medicine.  Showed diffuse bilateral airspace disease, right greater than left compatible with multifocal pneumonia.  Mobility: Encourage ambulation Diet: Regular diet Fluid: No IV fluid DVT prophylaxis:  Lovenox subcu Code Status:  Full code Family Communication:  Patient has been updating his family by himself Expected Discharge:  Continue inpatient management.  Consultants:  None  Procedures:  None  Antimicrobials: Anti-infectives (From admission, onward)   Start     Dose/Rate Route Frequency Ordered Stop   03/29/19 1000  remdesivir 100 mg in sodium chloride 0.9 % 100 mL IVPB     100 mg 200 mL/hr over 30 Minutes Intravenous Daily 03/28/19 0038 04/02/19 0959   03/28/19 0200  remdesivir 200 mg in sodium chloride 0.9% 250 mL IVPB     200 mg 580 mL/hr over 30 Minutes Intravenous Once 03/28/19 0038 03/28/19 0245        Code Status: Full Code   Diet Order            Diet regular Room service appropriate? Yes; Fluid consistency: Thin  Diet effective now              Infusions:  .  remdesivir 100 mg in NS 100 mL 100 mg (03/31/19 1030)    Scheduled Meds: . vitamin C  500 mg Oral Daily  . dexamethasone (DECADRON) injection  6 mg Intravenous Q24H  . enoxaparin (LOVENOX) injection  40 mg Subcutaneous Q12H  . multivitamin with minerals  1 tablet Oral Daily  . thiamine  100 mg Oral Daily  . zinc sulfate  220 mg Oral Daily    PRN meds: acetaminophen **OR** acetaminophen, chlorpheniramine-HYDROcodone, guaiFENesin-dextromethorphan, ondansetron  **OR** ondansetron (ZOFRAN) IV   Objective: Vitals:   03/31/19 0517 03/31/19 1153  BP: 95/69 (!) 86/58  Pulse: 65 81  Resp: 20 (!) 22  Temp: 99.1 F (37.3 C) 97.6 F (36.4 C)  SpO2: 98% 95%    Intake/Output Summary (Last 24 hours) at 03/31/2019 1400 Last data filed at 03/31/2019 1000 Gross per 24 hour  Intake 240 ml  Output --  Net 240 ml   Filed Weights   03/27/19 1334 03/28/19 1815  Weight: 59.9 kg 61.5 kg   Weight change:  Body mass index is 21.88 kg/m.   Physical Exam: General exam: Appears calm and comfortable.  Not in distress at rest.   Skin: No rashes, lesions or ulcers. HEENT: Atraumatic, normocephalic, supple neck, no obvious bleeding Lungs: Clear to auscultation bilaterally, no crackles, no wheezing. CVS: Regular rate and rhythm, no murmur GI/Abd soft, nontender, nondistended, bowel sound present CNS: Alert, awake, oriented x3 Psychiatry: Mood appropriate. Extremities: No pedal edema, no calf tenderness  Data Review: I have personally reviewed the laboratory data and studies available.  Recent Labs  Lab 03/27/19 1350 03/29/19 0324 03/30/19 0320 03/31/19 0347  WBC 4.3 6.1 4.7 4.1  NEUTROABS 3.5 5.4 3.9 2.8  HGB 13.5 12.4* 12.1* 13.1  HCT 39.0 37.4* 37.3* 39.9  MCV 96.3 100.8* 102.5* 101.3*  PLT 143* 151 181 174   Recent Labs  Lab 03/27/19 1350 03/29/19 0324 03/30/19 0320 03/31/19 0347  NA 140 139 138 136  K 3.4* 4.4 4.6 4.6  CL 106 110 107 102  CO2 24 23 23 26   GLUCOSE 136* 155* 158* 137*  BUN 22* 23* 26* 27*  CREATININE 0.82 0.70 0.71 0.96  CALCIUM 8.5* 8.2* 8.6* 8.7*    Terrilee Croak, MD  [facility] 03/31/2019

## 2019-04-01 LAB — CULTURE, BLOOD (ROUTINE X 2)
Culture: NO GROWTH
Culture: NO GROWTH
Special Requests: ADEQUATE
Special Requests: ADEQUATE

## 2019-04-01 LAB — CBC WITH DIFFERENTIAL/PLATELET
Abs Immature Granulocytes: 0.1 10*3/uL — ABNORMAL HIGH (ref 0.00–0.07)
Basophils Absolute: 0 10*3/uL (ref 0.0–0.1)
Basophils Relative: 1 %
Eosinophils Absolute: 0 10*3/uL (ref 0.0–0.5)
Eosinophils Relative: 0 %
HCT: 40.1 % (ref 39.0–52.0)
Hemoglobin: 13.1 g/dL (ref 13.0–17.0)
Immature Granulocytes: 2 %
Lymphocytes Relative: 17 %
Lymphs Abs: 0.7 10*3/uL (ref 0.7–4.0)
MCH: 33.2 pg (ref 26.0–34.0)
MCHC: 32.7 g/dL (ref 30.0–36.0)
MCV: 101.8 fL — ABNORMAL HIGH (ref 80.0–100.0)
Monocytes Absolute: 0.1 10*3/uL (ref 0.1–1.0)
Monocytes Relative: 3 %
Neutro Abs: 3.3 10*3/uL (ref 1.7–7.7)
Neutrophils Relative %: 77 %
Platelets: 203 10*3/uL (ref 150–400)
RBC: 3.94 MIL/uL — ABNORMAL LOW (ref 4.22–5.81)
RDW: 12.2 % (ref 11.5–15.5)
WBC: 4.3 10*3/uL (ref 4.0–10.5)
nRBC: 0 % (ref 0.0–0.2)

## 2019-04-01 LAB — COMPREHENSIVE METABOLIC PANEL
ALT: 81 U/L — ABNORMAL HIGH (ref 0–44)
AST: 33 U/L (ref 15–41)
Albumin: 3.2 g/dL — ABNORMAL LOW (ref 3.5–5.0)
Alkaline Phosphatase: 60 U/L (ref 38–126)
Anion gap: 7 (ref 5–15)
BUN: 33 mg/dL — ABNORMAL HIGH (ref 6–20)
CO2: 27 mmol/L (ref 22–32)
Calcium: 8.9 mg/dL (ref 8.9–10.3)
Chloride: 102 mmol/L (ref 98–111)
Creatinine, Ser: 0.78 mg/dL (ref 0.61–1.24)
GFR calc Af Amer: >60 mL/min (ref >60)
GFR calc non Af Amer: >60 mL/min (ref >60)
Glucose, Bld: 167 mg/dL — ABNORMAL HIGH (ref 70–99)
Potassium: 5.2 mmol/L — ABNORMAL HIGH (ref 3.5–5.1)
Sodium: 136 mmol/L (ref 135–145)
Total Bilirubin: 0.9 mg/dL (ref 0.3–1.2)
Total Protein: 6.7 g/dL (ref 6.5–8.1)

## 2019-04-01 LAB — C-REACTIVE PROTEIN: CRP: 1.1 mg/dL — ABNORMAL HIGH (ref <1.0)

## 2019-04-01 LAB — FERRITIN: Ferritin: 931 ng/mL — ABNORMAL HIGH (ref 24–336)

## 2019-04-01 LAB — D-DIMER, QUANTITATIVE: D-Dimer, Quant: 3.34 ug/mL-FEU — ABNORMAL HIGH (ref 0.00–0.50)

## 2019-04-01 MED ORDER — ZINC SULFATE 220 (50 ZN) MG PO CAPS: 220.0000 mg | ORAL_CAPSULE | Freq: Every day | ORAL | 0 refills | Status: AC

## 2019-04-01 MED ORDER — HYDROCOD POLST-CPM POLST ER 10-8 MG/5ML PO SUER: 5.0000 mL | Freq: Two times a day (BID) | ORAL | 0 refills | Status: AC | PRN

## 2019-04-01 MED ORDER — DEXAMETHASONE 6 MG PO TABS: 6.0000 mg | ORAL_TABLET | Freq: Every day | ORAL | 0 refills | Status: AC

## 2019-04-01 MED ORDER — ASCORBIC ACID 500 MG PO TABS: 500.0000 mg | ORAL_TABLET | Freq: Every day | ORAL | 0 refills | Status: AC

## 2019-04-01 NOTE — Progress Notes (Signed)
Patient has been discharged. No change from am assessment. Pt remains A&Ox4, ambulatory without assistance. Questions and or concerns were denied at this time.

## 2019-04-01 NOTE — Discharge Summary (Signed)
Physician Discharge Summary  Sergio Smith EHU:[id number] DOB: Nov 29, 1965 DOA: 03/27/2019  PCP: Patient, No Pcp Per  Admit date: 03/27/2019 Discharge date: 04/01/2019  Admitted From: Home Discharge disposition: Home   Code Status: Full Code  Diet Recommendation: Regular diet   Recommendations for Outpatient Follow-Up:   1. Follow-up with PCP as an outpatient  Discharge Diagnosis:   Principal Problem:   Acute hypoxemic respiratory failure ([place]) Active Problems:   Pneumonia due to COVID-19 virus   Acute respiratory failure due to COVID-19 [facility])   History of Present Illness / Brief narrative:  Sergio Smith is a 54 y.o. male with no significant past medical history who presented to the ED on 03/27/2019 with complaint of worsening shortness of breath, runny nose and subjective feeling of fever and chills for 2 to 3 days.   In the ER, patient was hypoxic requiring almost 5 L oxygen. chest x-ray showed bilateral infiltrates.  EKG showed normal sinus rhythm with RBBB and abnormal ST-T changes.  Covid antigen was positive WBC count normal at 6.1, procalcitonin level elevated to 2, lactic acid was elevated 2. Inflammatory markers including ferritin, CRP and D-dimer elevated as well.  Patient was admitted to hospitalist medicine service for further evaluation and management of Covid pneumonia.  Hospital Course:  COVID pneumonia Acute respiratory failure with hypoxia  -Presented with hypoxia, shortness of breath, chills, fever -Chest imaging -chest x-ray showed bilateral infiltrates -Treatment: Patient got 1 dose of Actemra in the ED.  Continue Decadron 6 mg daily for a total of 10 days, IV Remdesivir for 5 days completed on 1/8. -Supportive care: Vitamin C, Zinc, inhalers, Tylenol, Antitussives - benzonatate, Mucinex -Oxygen -patient required oxygen supplementation at first.  Off oxygen today.  Able to ambulate in the hallway without oxygen supplementation. -Continue contact isolation  precautions at home for a total of 3 weeks from the day of diagnosis.Marland Kitchen -WBC and inflammatory markers trend as below showing improvement in D-dimer, ferritin as well as CRP.   -Clinically patient still has not improved significantly.    He feels ready to go home today.  Lab Results  Component Value Date   SARSCOV2NAA POSITIVE (A) 03/27/2019    Recent Labs  Lab 03/27/19 1350 03/29/19 0324 03/30/19 0320 03/31/19 0347 04/01/19 0320  WBC 4.3 6.1 4.7 4.1 4.3   Recent Labs    03/30/19 0320 03/31/19 0347 04/01/19 0320  DDIMER 11.64* 6.06* 3.34*  FERRITIN 977* 941* 931*  CRP 3.4* 1.7* 1.1*    Elevated D-dimer -Likely because of Covid itself. -CT angio chest was obtained in the ED which did not show any pulmonary medicine.  Showed diffuse bilateral airspace disease, right greater than left compatible with multifocal pneumonia.  Stable for discharge to home today.   Subjective:  Feels better.  Not in distress.  Not oxygen supplementation.  Able to ambulate on hallway without supplemental oxygen.  Feels ready to go home today.  He wants some cough medicines to take home.  Discharge Exam:   Vitals:   03/31/19 2107 04/01/19 0013 04/01/19 0500 04/01/19 0920  BP: (!) '87/64 97/69 91/65 ' (!) 90/58  Pulse: 78 60 63 92  Resp: (!) '21 17 18 18  ' Temp: 98.6 F (37 C) 97.9 F (36.6 C) 97.8 F (36.6 C) 98 F (36.7 C)  TempSrc:  Oral Oral Oral  SpO2: 97% 96% 96% 98%  Weight:      Height:        Body mass index is 21.88 kg/m.  General exam:  Appears calm and comfortable.  Skin: No rashes, lesions or ulcers. HEENT: Atraumatic, normocephalic, supple neck, no obvious bleeding Lungs: Clear to auscultation bilaterally CVS: Regular rate and rhythm, no murmur GI/Abd soft, nontender, nondistended, bowel sound present CNS: Alert, awake, oriented x3 Psychiatry: Mood appropriate Extremities: No pedal edema, no calf tenderness  Discharge Instructions:  Wound care: None Discharge  Instructions    Diet general   Complete by: As directed    Increase activity slowly   Complete by: As directed      Follow-up [facility] Follow up.   Contact information: [address] [place] [address] [phone]         Allergies as of 04/01/2019   No Known Allergies     Medication List    STOP taking these medications   azithromycin 250 MG tablet Commonly known as: ZITHROMAX     TAKE these medications   acetaminophen 500 MG tablet Commonly known as: TYLENOL Take 500 mg by mouth every 6 (six) hours as needed for mild pain.   ascorbic acid 500 MG tablet Commonly known as: VITAMIN C Take 1 tablet (500 mg total) by mouth daily for 7 days. Start taking on: April 02, 2019   chlorpheniramine-HYDROcodone 10-8 MG/5ML Suer Commonly known as: TUSSIONEX Take 5 mLs by mouth every 12 (twelve) hours as needed for up to 14 days for cough.   dexamethasone 6 MG tablet Commonly known as: Decadron Take 1 tablet (6 mg total) by mouth daily for 5 days.   naproxen sodium 220 MG tablet Commonly known as: ALEVE Take 220 mg by mouth daily as needed (pain).   zinc sulfate 220 (50 Zn) MG capsule Take 1 capsule (220 mg total) by mouth daily for 7 days. Start taking on: April 02, 2019       Time coordinating discharge: 35 minutes  The results of significant diagnostics from this hospitalization (including imaging, microbiology, ancillary and laboratory) are listed below for reference.    Procedures and Diagnostic Studies:   DG Chest Portable 1 View  Result Date: 03/27/2019 CLINICAL DATA:  Shortness of breath EXAM: PORTABLE CHEST 1 VIEW COMPARISON:  10/02/2013 FINDINGS: The heart size and mediastinal contours are within normal limits. Extensive bilateral, predominantly bibasilar heterogeneous airspace opacity. The visualized skeletal structures are unremarkable. IMPRESSION: Extensive bilateral, predominantly  bibasilar heterogeneous airspace opacity, consistent with multifocal infection and COVID-19 if clinically suspected. Electronically Signed   By: Eddie Candle M.D.   On: 03/27/2019 14:35     Labs:   Basic Metabolic Panel: Recent Labs  Lab 03/27/19 1350 03/29/19 0324 03/30/19 0320 03/31/19 0347 04/01/19 0320  NA 140 139 138 136 136  K 3.4* 4.4 4.6 4.6 5.2*  CL 106 110 107 102 102  CO2 '24 23 23 26 27  ' GLUCOSE 136* 155* 158* 137* 167*  BUN 22* 23* 26* 27* 33*  CREATININE 0.82 0.70 0.71 0.96 0.78  CALCIUM 8.5* 8.2* 8.6* 8.7* 8.9   GFR Estimated Creatinine Clearance: 95 mL/min (by C-G formula based on SCr of 0.78 mg/dL). Liver Function Tests: Recent Labs  Lab 03/27/19 1350 03/29/19 0324 03/30/19 0320 03/31/19 0347 04/01/19 0320  AST 85* 68* 57* 47* 33  ALT 85* 80* 87* 88* 81*  ALKPHOS 45 46 51 51 60  BILITOT 0.9 0.7 0.6 0.7 0.9  PROT 6.4* 6.0* 6.0* 6.3* 6.7  ALBUMIN 3.1* 2.7* 2.8* 3.0* 3.2*   No results for input(s): LIPASE, AMYLASE in the  last 168 hours. No results for input(s): AMMONIA in the last 168 hours. Coagulation profile No results for input(s): INR, PROTIME in the last 168 hours.  CBC: Recent Labs  Lab 03/27/19 1350 03/29/19 0324 03/30/19 0320 03/31/19 0347 04/01/19 0320  WBC 4.3 6.1 4.7 4.1 4.3  NEUTROABS 3.5 5.4 3.9 2.8 3.3  HGB 13.5 12.4* 12.1* 13.1 13.1  HCT 39.0 37.4* 37.3* 39.9 40.1  MCV 96.3 100.8* 102.5* 101.3* 101.8*  PLT 143* 151 181 174 203   Cardiac Enzymes: No results for input(s): CKTOTAL, CKMB, CKMBINDEX, TROPONINI in the last 168 hours. BNP: Invalid input(s): POCBNP CBG: No results for input(s): GLUCAP in the last 168 hours. D-Dimer Recent Labs    03/31/19 0347 04/01/19 0320  DDIMER 6.06* 3.34*   Hgb A1c No results for input(s): HGBA1C in the last 72 hours. Lipid Profile No results for input(s): CHOL, HDL, LDLCALC, TRIG, CHOLHDL, LDLDIRECT in the last 72 hours. Thyroid function studies No results for input(s): TSH,  T4TOTAL, T3FREE, THYROIDAB in the last 72 hours.  Invalid input(s): FREET3 Anemia work up Recent Labs    03/31/19 0347 04/01/19 0320  FERRITIN 941* 931*   Microbiology Recent Results (from the past 240 hour(s))  Blood Culture (routine x 2)     Status: None   Collection Time: 03/27/19  1:50 PM   Specimen: BLOOD LEFT ARM  Result Value Ref Range Status   Specimen Description   Final    BLOOD LEFT ARM Performed at [facility], [address] [place], [place] [address]    Special Requests   Final    BOTTLES DRAWN AEROBIC AND ANAEROBIC Blood Culture adequate volume Performed at [facility], [address] [place], [place] [address]    Culture   Final    NO GROWTH 5 DAYS Performed at [facility], [place] [address] [place], [place] [address]    Report Status 04/01/2019 FINAL  Final  SARS Coronavirus 2 Ag (30 min TAT) - Nasal Swab (BD Veritor Kit)     Status: None   Collection Time: 03/27/19  2:00 PM   Specimen: Nasal Swab (BD Veritor Kit)  Result Value Ref Range Status   SARS Coronavirus 2 Ag NEGATIVE NEGATIVE Final    Comment: (NOTE) SARS-CoV-2 antigen NOT DETECTED.  Negative results are presumptive.  Negative results do not preclude SARS-CoV-2 infection and should not be used as the sole basis for treatment or other patient management decisions, including infection  control decisions, particularly in the presence of clinical signs and  symptoms consistent with COVID-19, or in those who have been in contact with the virus.  Negative results must be combined with clinical observations, patient history, and epidemiological information. The expected result is Negative. Fact Sheet for Patients: PodPark.tn Fact Sheet for Healthcare Providers: GiftContent.is This test is not yet approved or cleared by the [place] FDA and  has been authorized for detection and/or diagnosis of SARS-CoV-2  by FDA under an Emergency Use Authorization (EUA).  This EUA will remain in effect (meaning this test can be used) for the duration of  the COVID-19 de claration under Section 564(b)(1) of the Act, 21 U.S.C. section 360bbb-3(b)(1), unless the authorization is terminated or revoked sooner. Performed at [facility], [address] [place], [place] [address]   Blood Culture (routine x 2)     Status: None   Collection Time: 03/27/19  2:20 PM   Specimen: BLOOD LEFT HAND  Result Value Ref Range Status   Specimen Description   Final    BLOOD LEFT HAND Performed at [facility], [address] [place], [place] [address]    Special Requests   Final    BOTTLES DRAWN AEROBIC AND ANAEROBIC Blood Culture adequate volume Performed at [facility], [address] [place], [place] [address]    Culture   Final    NO GROWTH 5 DAYS Performed at [facility], [place] [address], [place], [place] [address]    Report Status 04/01/2019 FINAL  Final  Respiratory Panel by RT PCR (Flu A&B, Covid) - Nasopharyngeal Swab     Status: Abnormal   Collection Time: 03/27/19  2:45 PM   Specimen: Nasopharyngeal Swab  Result Value Ref Range Status   SARS Coronavirus 2 by RT PCR POSITIVE (A) NEGATIVE Final    Comment: CRITICAL RESULT CALLED TO, READ BACK BY AND VERIFIED WITH: S. GOUGE, RN ([facility]) AT 1900 ON 03/27/19 BY C. JESSUP, MT. (NOTE) SARS-CoV-2 target nucleic acids are DETECTED. SARS-CoV-2 RNA is generally detectable in upper respiratory specimens  during the acute phase of infection. Positive results are indicative of the presence of the identified virus, but do not rule out bacterial infection or co-infection with other pathogens not detected by the test. Clinical correlation with patient history and other diagnostic information is necessary to determine patient infection status. The expected result is Negative. Fact Sheet for Patients:   PinkCheek.be Fact Sheet for Healthcare Providers: GravelBags.it This test is not yet approved or cleared by the [place] FDA and  has been authorized for detection and/or diagnosis of SARS-CoV-2 by FDA under an Emergency Use Authorization (EUA).  This EUA will remain in effect (mea ning this test can be used) for the duration of  the COVID-19 declaration under Section 564(b)(1) of the Act, 21 U.S.C. section 360bbb-3(b)(1), unless the authorization is terminated or revoked sooner.    Influenza A by PCR NEGATIVE NEGATIVE Final   Influenza B by PCR NEGATIVE NEGATIVE Final    Comment: (NOTE) The Xpert Xpress SARS-CoV-2/FLU/RSV assay is intended as an aid in  the diagnosis of influenza from Nasopharyngeal swab specimens and  should not be used as a sole basis for treatment. Nasal washings and  aspirates are unacceptable for Xpert Xpress SARS-CoV-2/FLU/RSV  testing. Fact Sheet for Patients: PinkCheek.be Fact Sheet for Healthcare Providers: GravelBags.it This test is not yet approved or cleared by the [place] FDA and  has been authorized for detection and/or diagnosis of SARS-CoV-2 by  FDA under an Emergency Use Authorization (EUA). This EUA will remain  in effect (meaning this test can be used) for the duration of the  Covid-19 declaration under Section 564(b)(1) of the Act, 21  U.S.C. section 360bbb-3(b)(1), unless the authorization is  terminated or revoked. Performed at [facility], [place] [address] [place], [place] [address]     Please note: You were cared for by a hospitalist during your hospital stay. Once you are discharged, your primary care physician will handle any further medical issues. Please note that NO REFILLS for any discharge medications will be authorized once you are discharged, as it is imperative that you return to your primary care  physician (or establish a relationship with a primary care physician if you do not have one) for your post hospital discharge needs so that they can reassess your need for medications and monitor your lab values.  Signed: Terrilee Croak  [facility] 04/01/2019, 12:31 PM

## 2019-04-01 NOTE — Discharge Instructions (Signed)
COVID-19 COVID-19 is a respiratory infection that is caused by a virus called severe acute respiratory syndrome coronavirus 2 (SARS-CoV-2). The disease is also known as coronavirus disease or novel coronavirus. In some people, the virus may not cause any symptoms. In others, it may cause a serious infection. The infection can get worse quickly and can lead to complications, such as:  Pneumonia, or infection of the lungs.  Acute respiratory distress syndrome or ARDS. This is a condition in which fluid build-up in the lungs prevents the lungs from filling with air and passing oxygen into the blood.  Acute respiratory failure. This is a condition in which there is not enough oxygen passing from the lungs to the body or when carbon dioxide is not passing from the lungs out of the body.  Sepsis or septic shock. This is a serious bodily reaction to an infection.  Blood clotting problems.  Secondary infections due to bacteria or fungus.  Organ failure. This is when your body's organs stop working. The virus that causes COVID-19 is contagious. This means that it can spread from person to person through droplets from coughs and sneezes (respiratory secretions). What are the causes? This illness is caused by a virus. You may catch the virus by:  Breathing in droplets from an infected person. Droplets can be spread by a person breathing, speaking, singing, coughing, or sneezing.  Touching something, like a table or a doorknob, that was exposed to the virus (contaminated) and then touching your mouth, nose, or eyes. What increases the risk? Risk for infection You are more likely to be infected with this virus if you:  Are within 6 feet (2 meters) of a person with COVID-19.  Provide care for or live with a person who is infected with COVID-19.  Spend time in crowded indoor spaces or live in shared housing. Risk for serious illness You are more likely to become seriously ill from the virus if you:   Are 50 years of age or older. The higher your age, the more you are at risk for serious illness.  Live in a nursing home or long-term care facility.  Have cancer.  Have a long-term (chronic) disease such as: ? Chronic lung disease, including chronic obstructive pulmonary disease or asthma. ? A long-term disease that lowers your body's ability to fight infection (immunocompromised). ? Heart disease, including heart failure, a condition in which the arteries that lead to the heart become narrow or blocked (coronary artery disease), a disease which makes the heart muscle thick, weak, or stiff (cardiomyopathy). ? Diabetes. ? Chronic kidney disease. ? Sickle cell disease, a condition in which red blood cells have an abnormal "sickle" shape. ? Liver disease.  Are obese. What are the signs or symptoms? Symptoms of this condition can range from mild to severe. Symptoms may appear any time from 2 to 14 days after being exposed to the virus. They include:  A fever or chills.  A cough.  Difficulty breathing.  Headaches, body aches, or muscle aches.  Runny or stuffy (congested) nose.  A sore throat.  New loss of taste or smell. Some people may also have stomach problems, such as nausea, vomiting, or diarrhea. Other people may not have any symptoms of COVID-19. How is this diagnosed? This condition may be diagnosed based on:  Your signs and symptoms, especially if: ? You live in an area with a COVID-19 outbreak. ? You recently traveled to or from an area where the virus is common. ? You   provide care for or live with a person who was diagnosed with COVID-19. ? You were exposed to a person who was diagnosed with COVID-19.  A physical exam.  Lab tests, which may include: ? Taking a sample of fluid from the back of your nose and throat (nasopharyngeal fluid), your nose, or your throat using a swab. ? A sample of mucus from your lungs (sputum). ? Blood tests.  Imaging tests, which  may include, X-rays, CT scan, or ultrasound. How is this treated? At present, there is no medicine to treat COVID-19. Medicines that treat other diseases are being used on a trial basis to see if they are effective against COVID-19. Your health care provider will talk with you about ways to treat your symptoms. For most people, the infection is mild and can be managed at home with rest, fluids, and over-the-counter medicines. Treatment for a serious infection usually takes places in a hospital intensive care unit (ICU). It may include one or more of the following treatments. These treatments are given until your symptoms improve.  Receiving fluids and medicines through an IV.  Supplemental oxygen. Extra oxygen is given through a tube in the nose, a face mask, or a hood.  Positioning you to lie on your stomach (prone position). This makes it easier for oxygen to get into the lungs.  Continuous positive airway pressure (CPAP) or bi-level positive airway pressure (BPAP) machine. This treatment uses mild air pressure to keep the airways open. A tube that is connected to a motor delivers oxygen to the body.  Ventilator. This treatment moves air into and out of the lungs by using a tube that is placed in your windpipe.  Tracheostomy. This is a procedure to create a hole in the neck so that a breathing tube can be inserted.  Extracorporeal membrane oxygenation (ECMO). This procedure gives the lungs a chance to recover by taking over the functions of the heart and lungs. It supplies oxygen to the body and removes carbon dioxide. Follow these instructions at home: Lifestyle  If you are sick, stay home except to get medical care. Your health care provider will tell you how long to stay home. Call your health care provider before you go for medical care.  Rest at home as told by your health care provider.  Do not use any products that contain nicotine or tobacco, such as cigarettes, e-cigarettes, and  chewing tobacco. If you need help quitting, ask your health care provider.  Return to your normal activities as told by your health care provider. Ask your health care provider what activities are safe for you. General instructions  Take over-the-counter and prescription medicines only as told by your health care provider.  Drink enough fluid to keep your urine pale yellow.  Keep all follow-up visits as told by your health care provider. This is important. How is this prevented?  There is no vaccine to help prevent COVID-19 infection. However, there are steps you can take to protect yourself and others from this virus. To protect yourself:   Do not travel to areas where COVID-19 is a risk. The areas where COVID-19 is reported change often. To identify high-risk areas and travel restrictions, check the CDC travel website: wwwnc.cdc.gov/travel/notices  If you live in, or must travel to, an area where COVID-19 is a risk, take precautions to avoid infection. ? Stay away from people who are sick. ? Wash your hands often with soap and water for 20 seconds. If soap and water   are not available, use an alcohol-based hand sanitizer. ? Avoid touching your mouth, face, eyes, or nose. ? Avoid going out in public, follow guidance from your state and local health authorities. ? If you must go out in public, wear a cloth face covering or face mask. Make sure your mask covers your nose and mouth. ? Avoid crowded indoor spaces. Stay at least 6 feet (2 meters) away from others. ? Disinfect objects and surfaces that are frequently touched every day. This may include:  Counters and tables.  Doorknobs and light switches.  Sinks and faucets.  Electronics, such as phones, remote controls, keyboards, computers, and tablets. To protect others: If you have symptoms of COVID-19, take steps to prevent the virus from spreading to others.  If you think you have a COVID-19 infection, contact your health care  provider right away. Tell your health care team that you think you may have a COVID-19 infection.  Stay home. Leave your house only to seek medical care. Do not use public transport.  Do not travel while you are sick.  Wash your hands often with soap and water for 20 seconds. If soap and water are not available, use alcohol-based hand sanitizer.  Stay away from other members of your household. Let healthy household members care for children and pets, if possible. If you have to care for children or pets, wash your hands often and wear a mask. If possible, stay in your own room, separate from others. Use a different bathroom.  Make sure that all people in your household wash their hands well and often.  Cough or sneeze into a tissue or your sleeve or elbow. Do not cough or sneeze into your hand or into the air.  Wear a cloth face covering or face mask. Make sure your mask covers your nose and mouth. Where to find more information  Centers for Disease Control and Prevention: www.cdc.gov/coronavirus/2019-ncov/index.html  World Health Organization: www.who.int/health-topics/coronavirus Contact a health care provider if:  You live in or have traveled to an area where COVID-19 is a risk and you have symptoms of the infection.  You have had contact with someone who has COVID-19 and you have symptoms of the infection. Get help right away if:  You have trouble breathing.  You have pain or pressure in your chest.  You have confusion.  You have bluish lips and fingernails.  You have difficulty waking from sleep.  You have symptoms that get worse. These symptoms may represent a serious problem that is an emergency. Do not wait to see if the symptoms will go away. Get medical help right away. Call your local emergency services (911 in the [place] Do not drive yourself to the hospital. Let the emergency medical personnel know if you think you have COVID-19. Summary  COVID-19 is a  respiratory infection that is caused by a virus. It is also known as coronavirus disease or novel coronavirus. It can cause serious infections, such as pneumonia, acute respiratory distress syndrome, acute respiratory failure, or sepsis.  The virus that causes COVID-19 is contagious. This means that it can spread from person to person through droplets from breathing, speaking, singing, coughing, or sneezing.  You are more likely to develop a serious illness if you are 50 years of age or older, have a weak immune system, live in a nursing home, or have chronic disease.  There is no medicine to treat COVID-19. Your health care provider will talk with you about ways to treat your symptoms.    Take steps to protect yourself and others from infection. Wash your hands often and disinfect objects and surfaces that are frequently touched every day. Stay away from people who are sick and wear a mask if you are sick. This information is not intended to replace advice given to you by your health care provider. Make sure you discuss any questions you have with your health care provider. Document Revised: 01/07/2019 Document Reviewed: 04/15/2018 [employer] Patient Education  2020 [employer]  

## 2019-04-01 NOTE — Progress Notes (Signed)
SATURATION QUALIFICATIONS: (This note is used to comply with regulatory documentation for home oxygen)  Patient Saturations on Room Air at Rest = 98 %  Patient Saturations on Room Air while Ambulating = 92 %  Patient Saturations on 0 Liters of oxygen while Ambulating = 92 %  Please briefly explain why patient needs home oxygen: Patient complains of increased shortness of breath while out of bed and or with activity. Oxygen saturation during activity 90-92%. Will continue to monitor.

## 2020-11-22 IMAGING — CT CT ANGIO CHEST
2 of 6 series · 19 of 46 positions shown · IV contrast (OMNIPAQUE)
Comparison: One-view chest x-ray 03/27/2019

CLINICAL DATA: Shortness of breath. Chills. COVID positive.

EXAM:
CT ANGIOGRAPHY CHEST WITH CONTRAST
TECHNIQUE: Multidetector CT imaging of the chest was performed using the
standard protocol during bolus administration of intravenous
contrast. Multiplanar CT image reconstructions and MIPs were
obtained to evaluate the vascular anatomy.
CONTRAST:  80mL OMNIPAQUE IOHEXOL 350 MG/ML SOLN

[Series 7: thins · axial · 0.69mm/px · z∈[-238,+10]mm · 17 of 272 slices shown]
[im 12/272  lung]
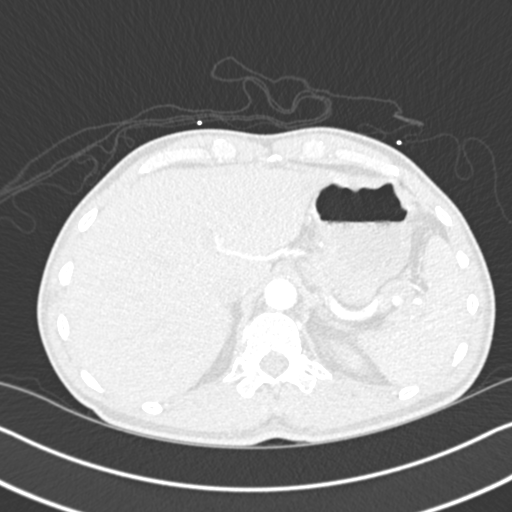
[im 24/272  soft-tissue]
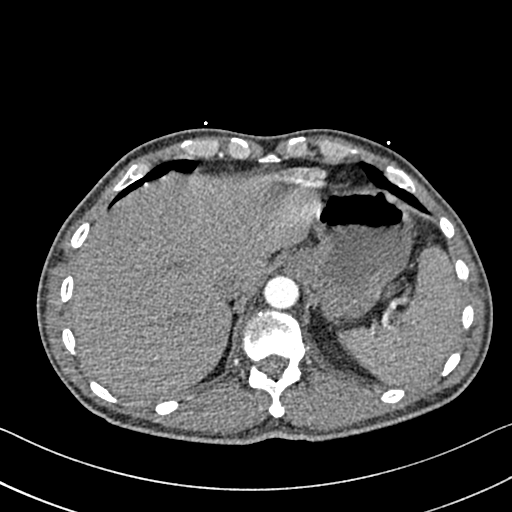
[im 48/272  lung]
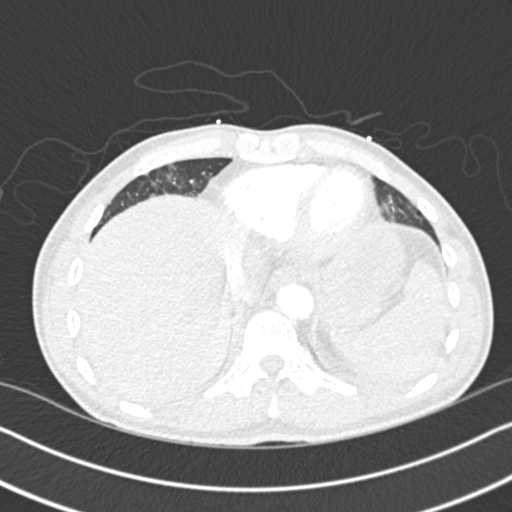
[im 59/272  soft-tissue]
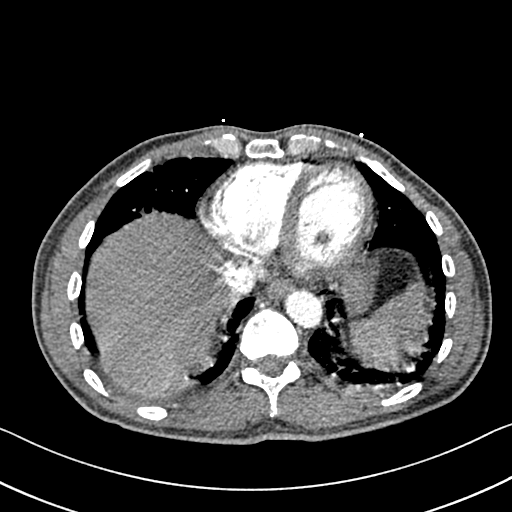
[im 71/272  lung]
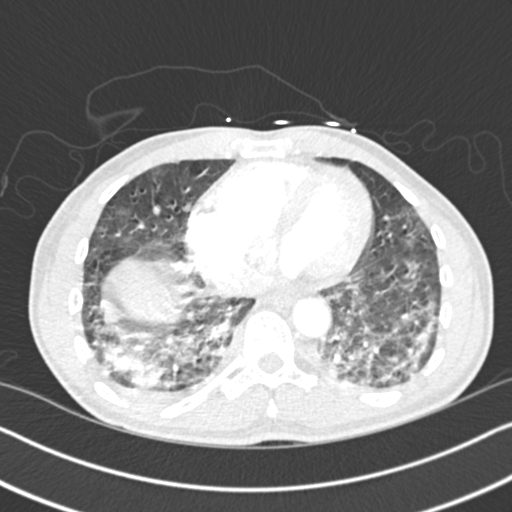
[im 95/272  soft-tissue]
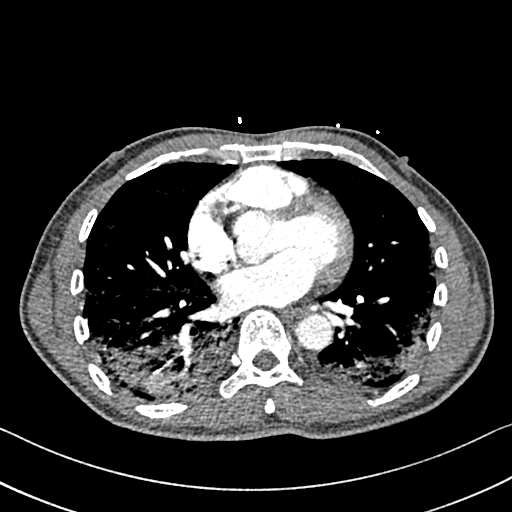
[im 107/272  lung]
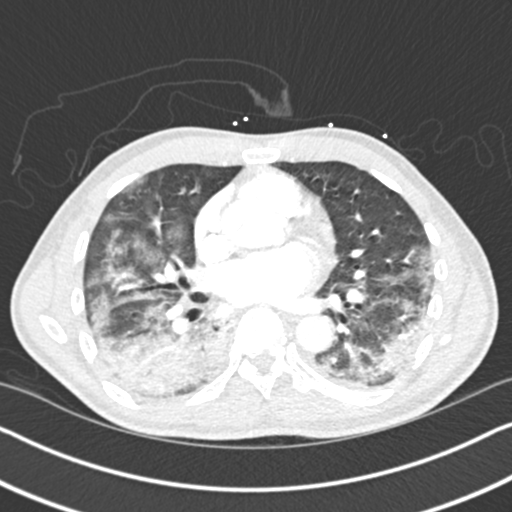
[im 118/272  soft-tissue]
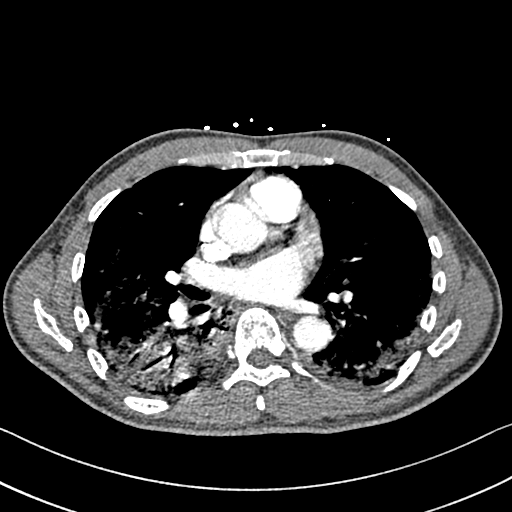
[im 142/272  lung]
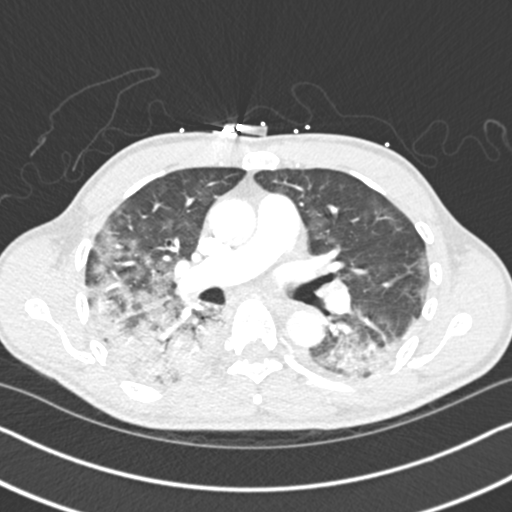
[im 154/272  soft-tissue]
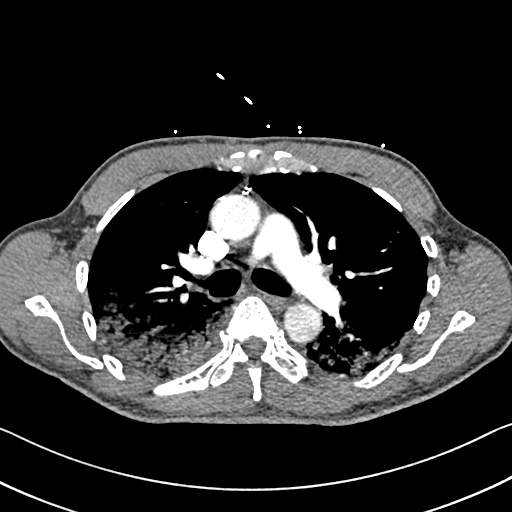
[im 165/272  lung]
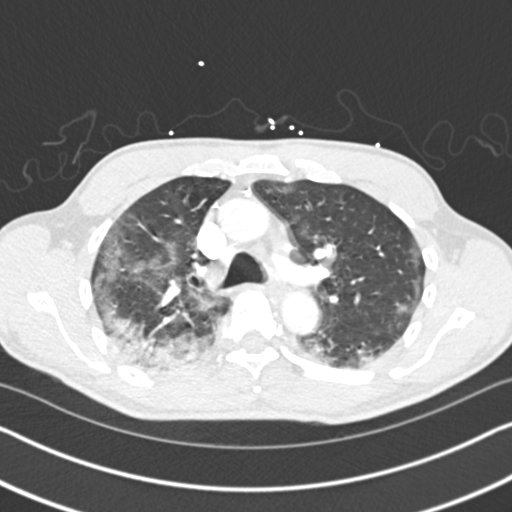
[im 177/272  soft-tissue]
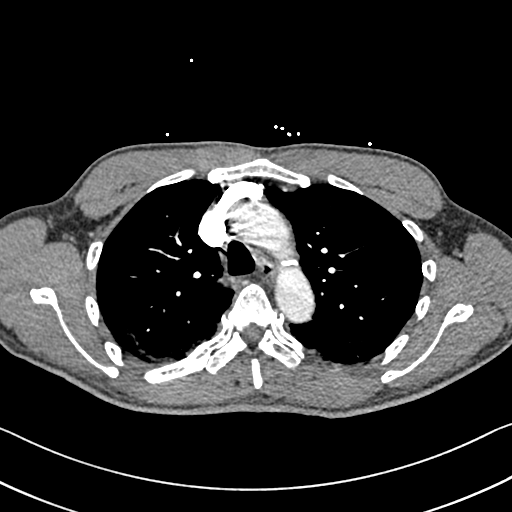
[im 201/272  lung]
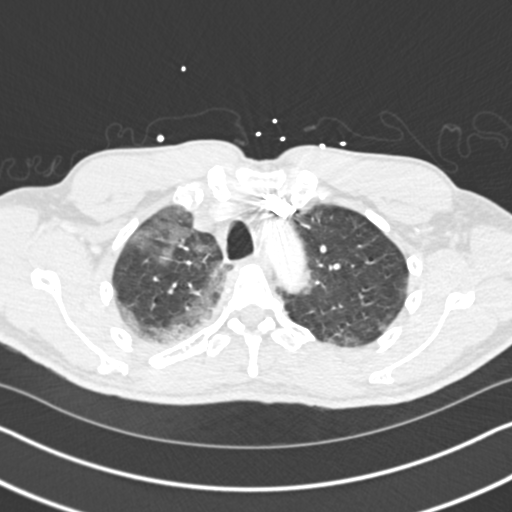
[im 213/272  soft-tissue]
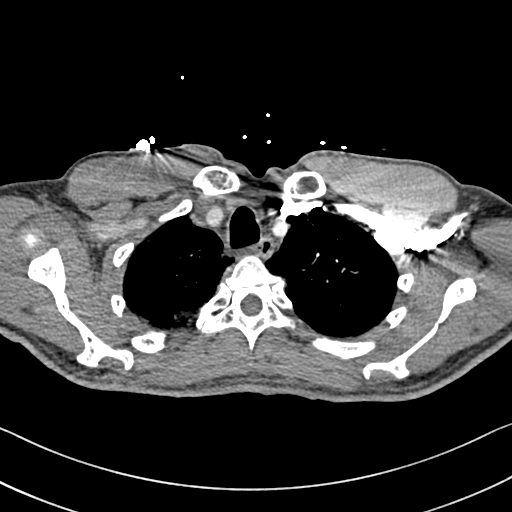
[im 224/272  lung]
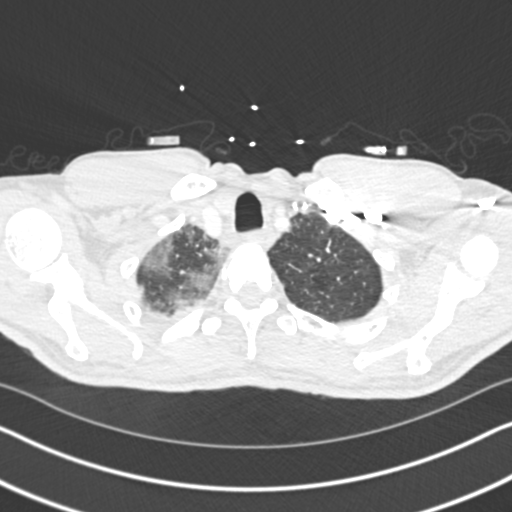
[im 248/272  soft-tissue]
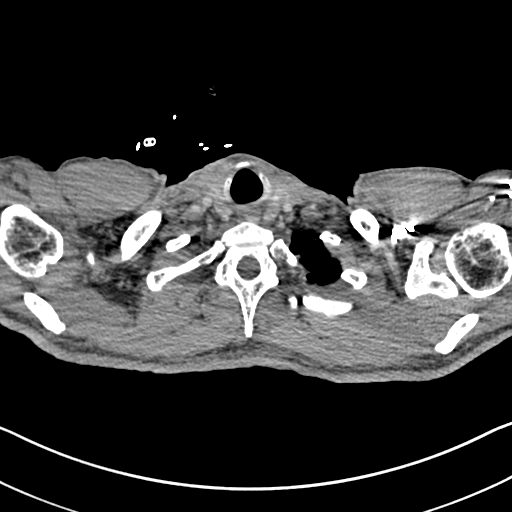
[im 260/272  lung]
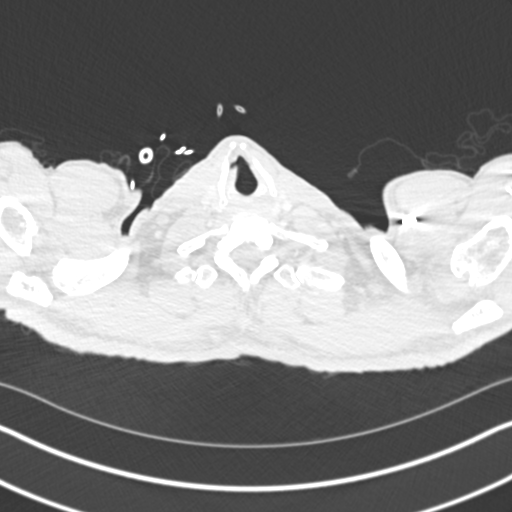

[Series 9: coronal mpr · coronal · 0.56mm/px · 2 of 68 slices shown]
[im 23/68  soft-tissue]
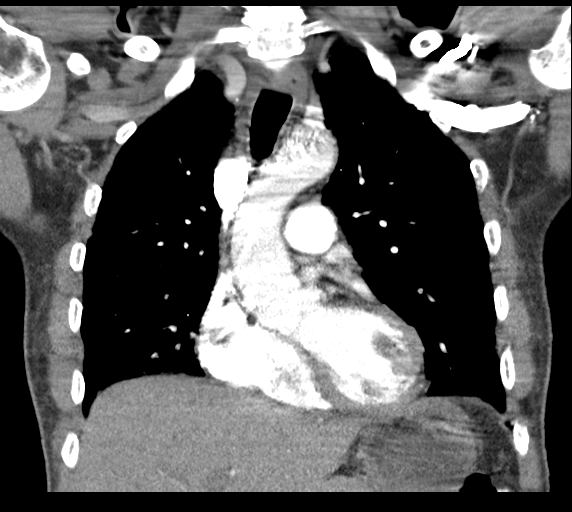
[im 45/68  soft-tissue]
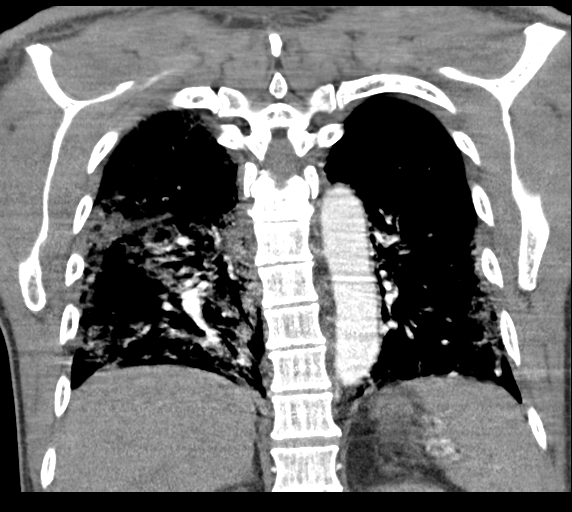

[19 of 46 positions shown; findings below may reference images not displayed]

FINDINGS: Cardiovascular: The heart size is normal. Aortic arch and great
vessel origins are within normal limits.

Pulmonary artery opacification is excellent. No focal filling
defects are present to suggest pulmonary embolus. There is some
breathing motion at the lung bases which obscures detail of smaller
vessels.

Mediastinum/Nodes: No significant mediastinal, hilar, or axillary
adenopathy is present.

Lungs/Pleura: Diffuse airspace consolidation is present bilaterally,
right greater than left. Disease is worse at the bases. More
scattered airspace disease in the upper lobes is also worse right
than left. No significant pleural effusion or pneumothorax is
present.

Upper Abdomen: No acute abnormality.

Musculoskeletal: No chest wall abnormality. No acute or significant
osseous findings.

Review of the MIP images confirms the above findings.
IMPRESSION: 1. No evidence for pulmonary embolus.
2. Diffuse bilateral airspace disease, right greater than left,
compatible with multifocal pneumonia.
3. No significant pleural effusion or pneumothorax.
